# Patient Record
Sex: Male | Born: 1990
Health system: Southern US, Community
[De-identification: ages and names within clinical notes are randomized; demographics above are authoritative.]

## PROBLEM LIST (undated history)

## (undated) DIAGNOSIS — F419 Anxiety disorder, unspecified: Secondary | ICD-10-CM

## (undated) HISTORY — PX: CHOLECYSTECTOMY: SHX55

## (undated) HISTORY — DX: Anxiety disorder, unspecified: F41.9

## (undated) HISTORY — PX: UMBILICAL HERNIA REPAIR: SUR1181

---
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
## 2009-10-15 ENCOUNTER — Ambulatory Visit: Payer: Self-pay | Admitting: Family Medicine

## 2010-01-14 ENCOUNTER — Emergency Department: Payer: Self-pay | Admitting: Emergency Medicine

## 2010-06-24 NOTE — Op Note (Signed)
NAMEAURYN, PAIGE NO.:  [id number]   MEDICAL RECORD NO.:  [id number]          PATIENT TYPE:  AMB   LOCATION:  DFTL                         FACILITY:  [facility]   PHYSICIAN:  Jon Gills, M.D.  DATE OF BIRTH:  [birth year]   DATE OF PROCEDURE:  05/04/2006  DATE OF DISCHARGE:  05/04/2006                               OPERATIVE REPORT   PREOPERATIVE DIAGNOSIS:  Epigastric abdominal pain and nausea.   POSTOPERATIVE DIAGNOSIS:  Epigastric abdominal pain and nausea.   OPERATION:  Upper gastrointestinal endoscopy with biopsy.   SURGEON:  Jon Gills, MD   ASSISTANTS:  None.   DESCRIPTION OF FINDINGS:  Following informed written consent, the  patient was taken to the operating room and placed under general  anesthesia with continuous cardiopulmonary monitoring.  He remained in  the supine position and the Pentax endoscope was passed by mouth and  advanced without difficulty.  A competent lower esophageal sphincter was  present 44 cm from the incisors.  There was no visual evidence for  esophagitis, gastritis, duodenitis or peptic ulcer disease.  A solitary  gastric biopsy was negative for Helicobacter.  Multiple esophageal,  gastric and duodenal biopsies were obtained and found to be  histologically normal, except for mild reflux esophagitis.  The  endoscope was gradually withdrawn and the patient was awakened and taken  to the recovery room in satisfactory condition.  He will be released  later today to the care of his family.   DESCRIPTION OF TECHNICAL PROCEDURES USED:  Pentax upper GI endoscope  with cold biopsy forceps.   DESCRIPTION OF SPECIMENS REMOVED:  Esophagus x3 in formalin, gastric x1  for CLOtesting, gastric x3 in formalin and duodenum x3 in formalin.           ______________________________  Jon Gills, M.D.     JHC/MEDQ  D:  05/17/2006  T:  05/18/2006  Job:  [id number]   cc:   [address], [place], [place] [address] Tracey Harries,  Eugenio Hoes. MD
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
## 2010-11-02 LAB — COMPREHENSIVE METABOLIC PANEL
ALT: 29
AST: 34
Albumin: 4.4
Alkaline Phosphatase: 73
Calcium: 9.3
Chloride: 104
Glucose, Bld: 92
Potassium: 4.8
Sodium: 138

## 2010-11-02 LAB — CBC
Hemoglobin: 16.5 — ABNORMAL HIGH
MCHC: 34.4
Platelets: 235
RBC: 5.13
RDW: 13.2

## 2010-11-02 LAB — URINE MICROSCOPIC-ADD ON

## 2010-11-02 LAB — DIFFERENTIAL
Basophils Absolute: 0.1
Lymphs Abs: 1.7
Monocytes Relative: 9

## 2010-11-02 LAB — RAPID URINE DRUG SCREEN, HOSP PERFORMED
Benzodiazepines: NOT DETECTED
Cocaine: NOT DETECTED

## 2010-11-02 LAB — URINALYSIS, ROUTINE W REFLEX MICROSCOPIC
Glucose, UA: NEGATIVE
Ketones, ur: 15 — AB
Leukocytes, UA: NEGATIVE

## 2013-11-19 ENCOUNTER — Emergency Department: Payer: Self-pay | Admitting: Emergency Medicine

## 2016-11-01 ENCOUNTER — Ambulatory Visit
Admission: RE | Admit: 2016-11-01 | Discharge: 2016-11-01 | Disposition: A | Discharge status: Home / Self Care | Payer: 59 | Source: Intra-hospital | Attending: Internal Medicine | Admitting: Internal Medicine

## 2016-11-01 ENCOUNTER — Other Ambulatory Visit: Payer: Self-pay | Admitting: Internal Medicine

## 2016-11-01 ENCOUNTER — Ambulatory Visit
Admission: RE | Admit: 2016-11-01 | Discharge: 2016-11-01 | Disposition: A | Discharge status: Home / Self Care | Payer: 59 | Source: Ambulatory Visit | Attending: Internal Medicine | Admitting: Internal Medicine

## 2016-11-01 DIAGNOSIS — R0602 Shortness of breath: Secondary | ICD-10-CM | POA: Diagnosis present

## 2017-09-13 ENCOUNTER — Ambulatory Visit (HOSPITAL_COMMUNITY)
Admission: EM | Admit: 2017-09-13 | Discharge: 2017-09-13 | Disposition: A | Discharge status: Home / Self Care | Payer: 59
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
## 2017-09-17 ENCOUNTER — Encounter (HOSPITAL_COMMUNITY): Payer: Self-pay | Admitting: Emergency Medicine

## 2017-09-17 ENCOUNTER — Emergency Department (HOSPITAL_COMMUNITY)
Admission: EM | Admit: 2017-09-17 | Discharge: 2017-09-17 | Disposition: A | Discharge status: Home / Self Care | Payer: Worker's Compensation | Attending: Emergency Medicine | Admitting: Emergency Medicine

## 2017-09-17 DIAGNOSIS — X131XXD Other contact with steam and other hot vapors, subsequent encounter: Secondary | ICD-10-CM | POA: Insufficient documentation

## 2017-09-17 DIAGNOSIS — Z043 Encounter for examination and observation following other accident: Secondary | ICD-10-CM | POA: Insufficient documentation

## 2017-09-17 DIAGNOSIS — Y9289 Other specified places as the place of occurrence of the external cause: Secondary | ICD-10-CM | POA: Diagnosis not present

## 2017-09-17 DIAGNOSIS — Z79899 Other long term (current) drug therapy: Secondary | ICD-10-CM | POA: Diagnosis not present

## 2017-09-17 DIAGNOSIS — T22011D Burn of unspecified degree of right forearm, subsequent encounter: Secondary | ICD-10-CM | POA: Diagnosis not present

## 2017-09-17 DIAGNOSIS — T300 Burn of unspecified body region, unspecified degree: Secondary | ICD-10-CM

## 2017-09-17 MED ORDER — SILVER SULFADIAZINE 1 % EX CREA: 1.0000 "application " | TOPICAL_CREAM | Freq: Every day | CUTANEOUS | 0 refills | Status: DC

## 2017-09-17 NOTE — ED Notes (Signed)
Pt stable, ambulatory, states understanding of discharge instructions 

## 2017-09-17 NOTE — ED Provider Notes (Signed)
[name] Provider Note   CSN: [id number] Arrival date & time: 09/17/17  1244   History   Chief Complaint Chief Complaint  Patient presents with  . Wound Check    HPI Joel Andersen is a 27 y.o. male.  HPI    27 year old male presents today or wound check. Patient notes approximately one week ago he suffered an injury to his right forearm where he had burst causing a burn injury to the forearm. He notes he was seen at urgent care, developed a blister over the area shortly after and was started on Silvadene cream. Patient notes since that time he has been using the Silvadene cream, he notes ongoing pain at the forearm, denies any significant discharge from the redness or fever. Patient was seen at occupational health and referred here for repeat evaluation.      History reviewed. No pertinent past medical history.  There are no active problems to display for this patient.   History reviewed. No pertinent surgical history.      Home Medications    Prior to Admission medications   Medication Sig Start Date End Date Taking? Authorizing Provider  venlafaxine XR (EFFEXOR-XR) 75 MG 24 hr capsule Take 75 mg by mouth daily. 07/30/17  Yes [provider]    Family History No family history on file.  Social History Social History   Tobacco Use  . Smoking status: Not on file  Substance Use Topics  . Alcohol use: Not on file  . Drug use: Not on file     Allergies   Patient has no known allergies.   Review of Systems Review of Systems  All other systems reviewed and are negative.  Physical Exam Updated Vital Signs BP 124/61 (BP Location: Right Arm)   Temp 98.6 F (37 C) (Oral)   Resp 18   SpO2 96%   Physical Exam  Constitutional: He is oriented to person, place, and time. He appears well-developed and well-nourished.  HENT:  Head: Normocephalic and atraumatic.  Eyes: Pupils are equal, round, and reactive to  light. Conjunctivae are normal. Right eye exhibits no discharge. Left eye exhibits no discharge. No scleral icterus.  Neck: Normal range of motion. No JVD present. No tracheal deviation present.  Pulmonary/Chest: Effort normal. No stridor.  Musculoskeletal:  Right forearm with large area of superficial partial thickness burn, granulation tissue noted,   Neurological: He is alert and oriented to person, place, and time. Coordination normal.  Psychiatric: He has a normal mood and affect. His behavior is normal. Judgment and thought content normal.  Nursing note and vitals reviewed.          ED Treatments / Results  Labs (all labs ordered are listed, but only abnormal results are displayed) Labs Reviewed - No data to display  EKG None  Radiology No results found.  Procedures Procedures (including critical care time)  Medications Ordered in ED Medications - No data to display   Initial Impression / Assessment and Plan / ED Course  I have reviewed the triage vital signs and the nursing notes.  Pertinent labs & imaging results that were available during my care of the patient were reviewed by me and considered in my medical decision making (see chart for details).     Labs:   Imaging:  Consults:  Therapeutics:  Discharge Meds:   Assessment/Plan: 27 year old male presents today for wound check. Patient has burn to the right upper extremity. He has no signs  of infection, I do feel he would benefit from wound care follow-up. He'll be referred to [facility], he is given strict return cautioned, he verbalized understanding and agreement to today's plan had no further questions concerns.   Final Clinical Impressions(s) / ED Diagnoses   Final diagnoses:  Burn    ED Discharge Orders    None       Rosalio LoudHedges, Leeum Sankey, PA-C 09/17/17 1631    Loren RacerYelverton, David, MD 09/17/17 [id number]

## 2017-09-17 NOTE — Discharge Instructions (Addendum)
Please read attached information. If you experience any new or worsening signs or symptoms please return to the emergency room for evaluation. Please follow-up with your primary care provider or specialist as discussed.  °

## 2017-09-17 NOTE — ED Triage Notes (Addendum)
Patient sent here from occupation health for wound check, cleaning, and to be setup with a wound clinic. Patient burned right forearm when a steam line ruptured at his work. Patient alert, oriented, and in on apparent distress at this time.

## 2018-09-20 ENCOUNTER — Ambulatory Visit: Payer: 59 | Admitting: Internal Medicine

## 2018-09-25 ENCOUNTER — Other Ambulatory Visit: Payer: Self-pay

## 2018-09-25 ENCOUNTER — Ambulatory Visit (INDEPENDENT_AMBULATORY_CARE_PROVIDER_SITE_OTHER): Payer: 59 | Admitting: Internal Medicine

## 2018-09-25 ENCOUNTER — Encounter: Payer: Self-pay | Admitting: Internal Medicine

## 2018-09-25 DIAGNOSIS — F411 Generalized anxiety disorder: Secondary | ICD-10-CM

## 2018-09-25 MED ORDER — VENLAFAXINE HCL ER 150 MG PO CP24: 150.0000 mg | ORAL_CAPSULE | Freq: Every day | ORAL | 2 refills | Status: DC

## 2018-09-25 MED ORDER — HYDROXYZINE HCL 10 MG PO TABS: 10.0000 mg | ORAL_TABLET | Freq: Every day | ORAL | 0 refills | Status: DC | PRN

## 2018-09-25 NOTE — Progress Notes (Signed)
HPI  Pt presents to the clinic today to establish care and for management of the conditions listed below. He is transferring care from [facility].  Anxiety: Chronic since being a teenager. He has no idea what triggers this. He has been on Citalopram in the past, transitioned to Effexor when that no longer seemed to work. He reports the Effexor worked great in the past, it just doesn't seem effecting. He feels like he is more anxious on a daily basis and now having periods which he would consider panic attacks. He has felt a little down due to not be able to life a normal life due to the pandemic, but denies overt depression. He has seen a therapist in the past and did not feel like it was helpful. He has supportive family and friends. He denies SI/HI.  Flu: 11/2016 Tetanus: 2019 Dentist: as needed  Past Medical History:  Diagnosis Date  . Anxiety     Current Outpatient Medications  Medication Sig Dispense Refill  . venlafaxine XR (EFFEXOR-XR) 75 MG 24 hr capsule Take 75 mg by mouth daily.  7   No current facility-administered medications for this visit.     Allergies  Allergen Reactions  . Hydrocodone-Acetaminophen Rash    Family History  Problem Relation Age of Onset  . Diabetes Mother   . Hypertension Mother   . Diabetes Maternal Grandmother   . Hypertension Maternal Grandmother     Social History   Socioeconomic History  . Marital status: Married    Spouse name: Not on file  . Number of children: Not on file  . Years of education: Not on file  . Highest education level: Not on file  Occupational History  . Not on file  Social Needs  . Financial resource strain: Not on file  . Food insecurity    Worry: Not on file    Inability: Not on file  . Transportation needs    Medical: Not on file    Non-medical: Not on file  Tobacco Use  . Smoking status: Never Smoker  . Smokeless tobacco: Current User    Types: Chew  Substance and Sexual Activity  . Alcohol  use: Yes    Comment: rare  . Drug use: Never  . Sexual activity: Not on file  Lifestyle  . Physical activity    Days per week: Not on file    Minutes per session: Not on file  . Stress: Not on file  Relationships  . Social connections    Talks on phone: Not on file    Gets together: Not on file    Attends religious service: Not on file    Active member of club or organization: Not on file    Attends meetings of clubs or organizations: Not on file    Relationship status: Not on file  . Intimate partner violence    Fear of current or ex partner: Not on file    Emotionally abused: Not on file    Physically abused: Not on file    Forced sexual activity: Not on file  Other Topics Concern  . Not on file  Social History Narrative  . Not on file    ROS:  Constitutional: Denies fever, malaise, fatigue, headache or abrupt weight changes.  Respiratory: Denies difficulty breathing, shortness of breath, cough or sputum production.   Cardiovascular: Denies chest pain, chest tightness, palpitations or swelling in the hands or feet.  Neurological: Denies dizziness, difficulty with memory, difficulty with   speech or problems with balance and coordination.  Psych: Pt reports anxiety and depression. Denies SI/HI.  No other specific complaints in a complete review of systems (except as listed in HPI above).  PE:  BP 134/80   Pulse 92   Temp 98.7 F (37.1 C) (Temporal)   Ht 5' 9" (1.753 m)   Wt 186 lb (84.4 kg)   SpO2 98%   BMI 27.47 kg/m   Wt Readings from Last 3 Encounters:  No data found for Wt    General: Appears his stated age, well developed, well nourished in NAD. Skin: Dry and intact. Cardiovascular: Normal rate and rhythm. S1,S2 noted.  No murmur, rubs or gallops noted.  Pulmonary/Chest: Normal effort and positive vesicular breath sounds. No respiratory distress. No wheezes, rales or ronchi noted.  Neurological: Alert and oriented.  Psychiatric: Anxious appearing.    BMET    Component Value Date/Time   NA 138 06/26/2007 1457   K 4.8 SLIGHT HEMOLYSIS 06/26/2007 1457   CL 104 06/26/2007 1457   CO2 26 06/26/2007 1457   GLUCOSE 92 06/26/2007 1457   BUN 8 06/26/2007 1457   CREATININE 0.79 06/26/2007 1457   CALCIUM 9.3 06/26/2007 1457   GFRNONAA NOT CALCULATED 06/26/2007 1457   GFRAA  06/26/2007 1457    NOT CALCULATED        The eGFR has been calculated using the MDRD equation. This calculation has not been validated in all clinical    Lipid Panel  No results found for: CHOL, TRIG, HDL, CHOLHDL, VLDL, LDLCALC  CBC    Component Value Date/Time   WBC 7.6 06/26/2007 1457   RBC 5.13 06/26/2007 1457   HGB 16.5 (H) 06/26/2007 1457   HCT 48.0 06/26/2007 1457   PLT 235 06/26/2007 1457   MCV 93.7 06/26/2007 1457   MCHC 34.4 06/26/2007 1457   RDW 13.2 06/26/2007 1457   LYMPHSABS 1.7 06/26/2007 1457   MONOABS 0.7 06/26/2007 1457   EOSABS 0.0 06/26/2007 1457   BASOSABS 0.1 06/26/2007 1457    Hgb A1C No results found for: HGBA1C   Assessment and Plan:

## 2018-09-25 NOTE — Patient Instructions (Signed)

## 2018-09-26 DIAGNOSIS — F411 Generalized anxiety disorder: Secondary | ICD-10-CM | POA: Insufficient documentation

## 2018-09-26 NOTE — Assessment & Plan Note (Signed)
Support offered today He declines referral to psychology for CBT Increase Effexor to 150 mg daily, RX sent to pharmacy RX for Hydroxyzine 10 mg daily prn for acute panic- sedation caution given  RTC in 1 month for your annual exam, follow up of anxiety

## 2018-10-18 ENCOUNTER — Other Ambulatory Visit: Payer: Self-pay | Admitting: Internal Medicine

## 2018-10-30 ENCOUNTER — Encounter: Payer: 59 | Admitting: Internal Medicine

## 2018-11-01 ENCOUNTER — Ambulatory Visit: Payer: 59 | Admitting: Internal Medicine

## 2018-11-13 ENCOUNTER — Encounter: Payer: 59 | Admitting: Internal Medicine

## 2018-11-28 ENCOUNTER — Other Ambulatory Visit: Payer: Self-pay | Admitting: Internal Medicine

## 2018-11-29 ENCOUNTER — Ambulatory Visit: Payer: 59 | Admitting: Internal Medicine

## 2018-11-29 ENCOUNTER — Encounter: Payer: Self-pay | Admitting: Internal Medicine

## 2018-11-29 ENCOUNTER — Other Ambulatory Visit: Payer: Self-pay

## 2018-11-29 DIAGNOSIS — F411 Generalized anxiety disorder: Secondary | ICD-10-CM | POA: Diagnosis not present

## 2018-11-29 MED ORDER — ALPRAZOLAM 0.25 MG PO TABS: 0.2500 mg | ORAL_TABLET | Freq: Every day | ORAL | 0 refills | Status: DC

## 2018-12-07 ENCOUNTER — Encounter: Payer: Self-pay | Admitting: Internal Medicine

## 2018-12-07 NOTE — Patient Instructions (Signed)

## 2018-12-07 NOTE — Progress Notes (Signed)
Subjective:    Patient ID: Joel Andersen, male    DOB: 03-11-1990, 28 y.o.   MRN: [id number]  HPI  Patient presents to the clinic today for follow-up of anxiety.  At his last visit he was started on Effexor and Hydroxyzine.  He reports the Effexor seems to be improving his mood and decreasing his overall anxiety but the Hydroxyzine does not appear to be doing anything for his panic attacks.  He is still unable to identify triggers for his anxiety.  He is not currently seeing a therapist.  He denies depression, SI/HI.  Review of Systems      Past Medical History:  Diagnosis Date  . Anxiety     Current Outpatient Medications  Medication Sig Dispense Refill  . hydrOXYzine (ATARAX/VISTARIL) 10 MG tablet Take 1 tablet (10 mg total) by mouth daily as needed. 30 tablet 0  . venlafaxine XR (EFFEXOR-XR) 150 MG 24 hr capsule TAKE 1 CAPSULE (150 MG TOTAL) BY MOUTH DAILY WITH BREAKFAST. 30 capsule 0  . ALPRAZolam (XANAX) 0.25 MG tablet Take 1 tablet (0.25 mg total) by mouth daily. 20 tablet 0   No current facility-administered medications for this visit.     Allergies  Allergen Reactions  . Hydrocodone-Acetaminophen Rash    Family History  Problem Relation Age of Onset  . Diabetes Mother   . Hypertension Mother   . Diabetes Maternal Grandmother   . Hypertension Maternal Grandmother     Social History   Socioeconomic History  . Marital status: Married    Spouse name: Not on file  . Number of children: Not on file  . Years of education: Not on file  . Highest education level: Not on file  Occupational History  . Not on file  Social Needs  . Financial resource strain: Not on file  . Food insecurity    Worry: Not on file    Inability: Not on file  . Transportation needs    Medical: Not on file    Non-medical: Not on file  Tobacco Use  . Smoking status: Never Smoker  . Smokeless tobacco: Current User    Types: Chew  Substance and Sexual Activity  . Alcohol use: Yes   Comment: rare  . Drug use: Never  . Sexual activity: Not on file  Lifestyle  . Physical activity    Days per week: Not on file    Minutes per session: Not on file  . Stress: Not on file  Relationships  . Social Herbalist on phone: Not on file    Gets together: Not on file    Attends religious service: Not on file    Active member of club or organization: Not on file    Attends meetings of clubs or organizations: Not on file    Relationship status: Not on file  . Intimate partner violence    Fear of current or ex partner: Not on file    Emotionally abused: Not on file    Physically abused: Not on file    Forced sexual activity: Not on file  Other Topics Concern  . Not on file  Social History Narrative  . Not on file     Constitutional: Denies fever, malaise, fatigue, headache or abrupt weight changes.  Respiratory: Denies difficulty breathing, shortness of breath, cough or sputum production.   Cardiovascular: Denies chest pain, chest tightness, palpitations or swelling in the hands or feet.  difficulty with speech or problems with balance  and coordination.  Psych: Patient reports anxiety.  Denies  depression, SI/HI.  No other specific complaints in a complete review of systems (except as listed in HPI above).  Objective:   Physical Exam   BP 124/90 (BP Location: Left Arm, Patient Position: Sitting, Cuff Size: Normal)   Pulse (!) 107   Temp 99.3 F (37.4 C)   Ht '5\' 9"'$  (1.753 m)   Wt 185 lb (83.9 kg)   SpO2 96%   BMI 27.32 kg/m  Wt Readings from Last 3 Encounters:  11/29/18 185 lb (83.9 kg)  09/25/18 186 lb (84.4 kg)    General: Appears his stated age, well developed, well nourished in NAD.Marland Kitchen  Cardiovascular: Tachycardic with normal rhythm. S1,S2 noted.  No murmur, rubs or gallops noted. No JVD or BLE edema. No carotid bruits noted. Pulmonary/Chest: Normal effort and positive vesicular breath sounds. No respiratory distress. No wheezes, rales or rhonchi  noted. Neurological: Alert and oriented.  Psychiatric: Mood and affect normal.  Mildly anxious appearing.  Judgment and thought content normal.    BMET    Component Value Date/Time   NA 138 06/26/2007 1457   K 4.8 SLIGHT HEMOLYSIS 06/26/2007 1457   CL 104 06/26/2007 1457   CO2 26 06/26/2007 1457   GLUCOSE 92 06/26/2007 1457   BUN 8 06/26/2007 1457   CREATININE 0.79 06/26/2007 1457   CALCIUM 9.3 06/26/2007 1457   GFRNONAA NOT CALCULATED 06/26/2007 1457   GFRAA  06/26/2007 1457    NOT CALCULATED        The eGFR has been calculated using the MDRD equation. This calculation has not been validated in all clinical    Lipid Panel  No results found for: CHOL, TRIG, HDL, CHOLHDL, VLDL, LDLCALC  CBC    Component Value Date/Time   WBC 7.6 06/26/2007 1457   RBC 5.13 06/26/2007 1457   HGB 16.5 (H) 06/26/2007 1457   HCT 48.0 06/26/2007 1457   PLT 235 06/26/2007 1457   MCV 93.7 06/26/2007 1457   MCHC 34.4 06/26/2007 1457   RDW 13.2 06/26/2007 1457   LYMPHSABS 1.7 06/26/2007 1457   MONOABS 0.7 06/26/2007 1457   EOSABS 0.0 06/26/2007 1457   BASOSABS 0.1 06/26/2007 1457    Hgb A1C No results found for: HGBA1C         Assessment & Plan:

## 2018-12-07 NOTE — Assessment & Plan Note (Signed)
Continue Effexor Advised him to take Hydroxyzine as needed for sleep Rx for Xanax 0.25 mg to take on a very rare basis as needed for panic-addiction caution given Support offered today Will obtain CSA and UDS at next visit

## 2018-12-10 ENCOUNTER — Other Ambulatory Visit: Payer: Self-pay | Admitting: Internal Medicine

## 2019-01-13 ENCOUNTER — Encounter: Payer: BC Managed Care – PPO | Admitting: Internal Medicine

## 2019-01-13 NOTE — Progress Notes (Deleted)
Subjective:    Patient ID: Joel Andersen, male    DOB: 18-Nov-1990, 28 y.o.   MRN: [id number]  HPI  Pt presents to the clinic today for his annual exam.  Flu: Tetanus: Dentist:  Diet: Exercise:  Review of Systems  Past Medical History:  Diagnosis Date  . Anxiety     Current Outpatient Medications  Medication Sig Dispense Refill  . ALPRAZolam (XANAX) 0.25 MG tablet Take 1 tablet (0.25 mg total) by mouth daily. 20 tablet 0  . hydrOXYzine (ATARAX/VISTARIL) 10 MG tablet Take 1 tablet (10 mg total) by mouth daily as needed. 30 tablet 0  . venlafaxine XR (EFFEXOR-XR) 150 MG 24 hr capsule TAKE 1 CAPSULE (150 MG TOTAL) BY MOUTH DAILY WITH BREAKFAST. 90 capsule 0   No current facility-administered medications for this visit.     Allergies  Allergen Reactions  . Hydrocodone-Acetaminophen Rash    Family History  Problem Relation Age of Onset  . Diabetes Mother   . Hypertension Mother   . Diabetes Maternal Grandmother   . Hypertension Maternal Grandmother     Social History   Socioeconomic History  . Marital status: Married    Spouse name: Not on file  . Number of children: Not on file  . Years of education: Not on file  . Highest education level: Not on file  Occupational History  . Not on file  Social Needs  . Financial resource strain: Not on file  . Food insecurity    Worry: Not on file    Inability: Not on file  . Transportation needs    Medical: Not on file    Non-medical: Not on file  Tobacco Use  . Smoking status: Never Smoker  . Smokeless tobacco: Current User    Types: Chew  Substance and Sexual Activity  . Alcohol use: Yes    Comment: rare  . Drug use: Never  . Sexual activity: Not on file  Lifestyle  . Physical activity    Days per week: Not on file    Minutes per session: Not on file  . Stress: Not on file  Relationships  . Social Herbalist on phone: Not on file    Gets together: Not on file    Attends religious service: Not  on file    Active member of club or organization: Not on file    Attends meetings of clubs or organizations: Not on file    Relationship status: Not on file  . Intimate partner violence    Fear of current or ex partner: Not on file    Emotionally abused: Not on file    Physically abused: Not on file    Forced sexual activity: Not on file  Other Topics Concern  . Not on file  Social History Narrative  . Not on file     Constitutional: Denies fever, malaise, fatigue, headache or abrupt weight changes.  HEENT: Denies eye pain, eye redness, ear pain, ringing in the ears, wax buildup, runny nose, nasal congestion, bloody nose, or sore throat. Respiratory: Denies difficulty breathing, shortness of breath, cough or sputum production.   Cardiovascular: Denies chest pain, chest tightness, palpitations or swelling in the hands or feet.  Gastrointestinal: Denies abdominal pain, bloating, constipation, diarrhea or blood in the stool.  GU: Denies urgency, frequency, pain with urination, burning sensation, blood in urine, odor or discharge. Musculoskeletal: Denies decrease in range of motion, difficulty with gait, muscle pain or joint pain and swelling.  Skin: Denies redness, rashes, lesions or ulcercations.  Neurological: Denies dizziness, difficulty with memory, difficulty with speech or problems with balance and coordination.  Psych: Denies anxiety, depression, SI/HI.  No other specific complaints in a complete review of systems (except as listed in HPI above).     Objective:   Physical Exam  There were no vitals taken for this visit. Wt Readings from Last 3 Encounters:  11/29/18 185 lb (83.9 kg)  09/25/18 186 lb (84.4 kg)    General: Appears their stated age, well developed, well nourished in NAD. Skin: Warm, dry and intact. No rashes, lesions or ulcerations noted. HEENT: Head: normal shape and size; Eyes: sclera white, no icterus, conjunctiva pink, PERRLA and EOMs intact; Ears: Tm's  gray and intact, normal light reflex; Nose: mucosa pink and moist, septum midline; Throat/Mouth: Teeth present, mucosa pink and moist, no exudate, lesions or ulcerations noted.  Neck:  Neck supple, trachea midline. No masses, lumps or thyromegaly present.  Cardiovascular: Normal rate and rhythm. S1,S2 noted.  No murmur, rubs or gallops noted. No JVD or BLE edema. No carotid bruits noted. Pulmonary/Chest: Normal effort and positive vesicular breath sounds. No respiratory distress. No wheezes, rales or ronchi noted.  Abdomen: Soft and nontender. Normal bowel sounds. No distention or masses noted. Liver, spleen and kidneys non palpable. Musculoskeletal: Normal range of motion. No signs of joint swelling. No difficulty with gait.  Neurological: Alert and oriented. Cranial nerves II-XII grossly intact. Coordination normal.  Psychiatric: Mood and affect normal. Behavior is normal. Judgment and thought content normal.   EKG:  BMET    Component Value Date/Time   NA 138 06/26/2007 1457   K 4.8 SLIGHT HEMOLYSIS 06/26/2007 1457   CL 104 06/26/2007 1457   CO2 26 06/26/2007 1457   GLUCOSE 92 06/26/2007 1457   BUN 8 06/26/2007 1457   CREATININE 0.79 06/26/2007 1457   CALCIUM 9.3 06/26/2007 1457   GFRNONAA NOT CALCULATED 06/26/2007 1457   GFRAA  06/26/2007 1457    NOT CALCULATED        The eGFR has been calculated using the MDRD equation. This calculation has not been validated in all clinical    Lipid Panel  No results found for: CHOL, TRIG, HDL, CHOLHDL, VLDL, LDLCALC  CBC    Component Value Date/Time   WBC 7.6 06/26/2007 1457   RBC 5.13 06/26/2007 1457   HGB 16.5 (H) 06/26/2007 1457   HCT 48.0 06/26/2007 1457   PLT 235 06/26/2007 1457   MCV 93.7 06/26/2007 1457   MCHC 34.4 06/26/2007 1457   RDW 13.2 06/26/2007 1457   LYMPHSABS 1.7 06/26/2007 1457   MONOABS 0.7 06/26/2007 1457   EOSABS 0.0 06/26/2007 1457   BASOSABS 0.1 06/26/2007 1457    Hgb A1C No results found for: HGBA1C           Assessment & Plan:   Preventative Health Maintenance:  Flu shot Tetanus Encouraged him to consume a balanced diet and exercise regimen Advised him to see a dentist annually Will check CBC, CMET, Lipid profile today  RTC in 1 year, sooner if needed Webb Silversmith, NP This visit occurred during the SARS-CoV-2 public health emergency.  Safety protocols were in place, including screening questions prior to the visit, additional usage of staff PPE, and extensive cleaning of exam room while observing appropriate contact time as indicated for disinfecting solutions.

## 2019-01-15 ENCOUNTER — Other Ambulatory Visit: Payer: Self-pay

## 2019-01-15 DIAGNOSIS — Z20822 Contact with and (suspected) exposure to covid-19: Secondary | ICD-10-CM

## 2019-01-17 ENCOUNTER — Encounter: Payer: Self-pay | Admitting: Internal Medicine

## 2019-01-17 ENCOUNTER — Ambulatory Visit (INDEPENDENT_AMBULATORY_CARE_PROVIDER_SITE_OTHER): Payer: BC Managed Care – PPO | Admitting: Internal Medicine

## 2019-01-17 DIAGNOSIS — R197 Diarrhea, unspecified: Secondary | ICD-10-CM

## 2019-01-17 DIAGNOSIS — R519 Headache, unspecified: Secondary | ICD-10-CM

## 2019-01-17 DIAGNOSIS — R05 Cough: Secondary | ICD-10-CM | POA: Diagnosis not present

## 2019-01-17 DIAGNOSIS — R0989 Other specified symptoms and signs involving the circulatory and respiratory systems: Secondary | ICD-10-CM

## 2019-01-17 DIAGNOSIS — Z20828 Contact with and (suspected) exposure to other viral communicable diseases: Secondary | ICD-10-CM

## 2019-01-17 DIAGNOSIS — R1033 Periumbilical pain: Secondary | ICD-10-CM

## 2019-01-17 DIAGNOSIS — R059 Cough, unspecified: Secondary | ICD-10-CM

## 2019-01-17 DIAGNOSIS — R0602 Shortness of breath: Secondary | ICD-10-CM

## 2019-01-17 DIAGNOSIS — R110 Nausea: Secondary | ICD-10-CM

## 2019-01-17 DIAGNOSIS — Z20822 Contact with and (suspected) exposure to covid-19: Secondary | ICD-10-CM

## 2019-01-17 LAB — NOVEL CORONAVIRUS, NAA: SARS-CoV-2, NAA: NOT DETECTED

## 2019-01-17 MED ORDER — PROMETHAZINE-DM 6.25-15 MG/5ML PO SYRP: 5.0000 mL | ORAL_SOLUTION | Freq: Four times a day (QID) | ORAL | 0 refills | Status: DC | PRN

## 2019-01-17 MED ORDER — IBUPROFEN 800 MG PO TABS: 800.0000 mg | ORAL_TABLET | Freq: Three times a day (TID) | ORAL | 0 refills | Status: DC | PRN

## 2019-01-17 MED ORDER — ALBUTEROL SULFATE HFA 108 (90 BASE) MCG/ACT IN AERS: 2.0000 | INHALATION_SPRAY | Freq: Four times a day (QID) | RESPIRATORY_TRACT | 0 refills | Status: DC | PRN

## 2019-01-17 NOTE — Patient Instructions (Signed)
COVID-19 Frequently Asked Questions °COVID-19 (coronavirus disease) is an infection that is caused by a large family of viruses. Some viruses cause illness in people and others cause illness in animals like camels, cats, and bats. In some cases, the viruses that cause illness in animals can spread to humans. °Where did the coronavirus come from? °In December 2019, [place] told the World Health Organization (WHO) of several cases of lung disease (human respiratory illness). These cases were linked to an open seafood and livestock market in the city of [place]. The link to the seafood and livestock market suggests that the virus may have spread from animals to humans. However, since that first outbreak in December, the virus has also been shown to spread from person to person. °What is the name of the disease and the virus? °Disease name °Early on, this disease was called novel coronavirus. This is because scientists determined that the disease was caused by a new (novel) respiratory virus. The World Health Organization (WHO) has now named the disease COVID-19, or coronavirus disease. °Virus name °The virus that causes the disease is called severe acute respiratory syndrome coronavirus 2 (SARS-CoV-2). °More information on disease and virus naming °World Health Organization (WHO): www.who.int/emergencies/diseases/novel-coronavirus-2019/technical-guidance/naming-the-coronavirus-disease-(covid-2019)-and-the-virus-that-causes-it °Who is at risk for complications from coronavirus disease? °Some people may be at higher risk for complications from coronavirus disease. This includes older adults and people who have chronic diseases, such as heart disease, diabetes, and lung disease. °If you are at higher risk for complications, take these extra precautions: °· Avoid close contact with people who are sick or have a fever or cough. Stay at least 3-6 ft (1-2 m) away from them, if possible. °· Wash your hands often with soap and  water for at least 20 seconds. °· Avoid touching your face, mouth, nose, or eyes. °· Keep supplies on hand at home, such as food, medicine, and cleaning supplies. °· Stay home as much as possible. °· Avoid social gatherings and travel. °How does coronavirus disease spread? °The virus that causes coronavirus disease spreads easily from person to person (is contagious). There are also cases of community-spread disease. This means the disease has spread to: °· People who have no known contact with other infected people. °· People who have not traveled to areas where there are known cases. °It appears to spread from one person to another through droplets from coughing or sneezing. °Can I get the virus from touching surfaces or objects? °There is still a lot that we do not know about the virus that causes coronavirus disease. Scientists are basing a lot of information on what they know about similar viruses, such as: °· Viruses cannot generally survive on surfaces for long. They need a human body (host) to survive. °· It is more likely that the virus is spread by close contact with people who are sick (direct contact), such as through: °? Shaking hands or hugging. °? Breathing in respiratory droplets that travel through the air. This can happen when an infected person coughs or sneezes on or near other people. °· It is less likely that the virus is spread when a person touches a surface or object that has the virus on it (indirect contact). The virus may be able to enter the body if the person touches a surface or object and then touches his or her face, eyes, nose, or mouth. °Can a person spread the virus without having symptoms of the disease? °It may be possible for the virus to spread before a person   has symptoms of the disease, but this is most likely not the main way the virus is spreading. It is more likely for the virus to spread by being in close contact with people who are sick and breathing in the respiratory  droplets of a sick person's cough or sneeze. °What are the symptoms of coronavirus disease? °Symptoms vary from person to person and can range from mild to severe. Symptoms may include: °· Fever. °· Cough. °· Tiredness, weakness, or fatigue. °· Fast breathing or feeling [place] of breath. °These symptoms can appear anywhere from 2 to 14 days after you have been exposed to the virus. If you develop symptoms, call your health care provider. People with severe symptoms may need hospital care. °If I am exposed to the virus, how long does it take before symptoms start? °Symptoms of coronavirus disease may appear anywhere from 2 to 14 days after a person has been exposed to the virus. If you develop symptoms, call your health care provider. °Should I be tested for this virus? °Your health care provider will decide whether to test you based on your symptoms, history of exposure, and your risk factors. °How does a health care provider test for this virus? °Health care providers will collect samples to send for testing. Samples may include: °· Taking a swab of fluid from the nose. °· Taking fluid from the lungs by having you cough up mucus (sputum) into a sterile cup. °· Taking a blood sample. °· Taking a stool or urine sample. °Is there a treatment or vaccine for this virus? °Currently, there is no vaccine to prevent coronavirus disease. Also, there are no medicines like antibiotics or antivirals to treat the virus. A person who becomes sick is given supportive care, which means rest and fluids. A person may also relieve his or her symptoms by using over-the-counter medicines that treat sneezing, coughing, and runny nose. These are the same medicines that a person takes for the common cold. °If you develop symptoms, call your health care provider. People with severe symptoms may need hospital care. °What can I do to protect myself and my family from this virus? ° °  ° °You can protect yourself and your family by taking the  same actions that you would take to prevent the spread of other viruses. Take the following actions: °· Wash your hands often with soap and water for at least 20 seconds. If soap and water are not available, use alcohol-based hand sanitizer. °· Avoid touching your face, mouth, nose, or eyes. °· Cough or sneeze into a tissue, sleeve, or elbow. Do not cough or sneeze into your hand or the air. °? If you cough or sneeze into a tissue, throw it away immediately and wash your hands. °· Disinfect objects and surfaces that you frequently touch every day. °· Avoid close contact with people who are sick or have a fever or cough. Stay at least 3-6 ft (1-2 m) away from them, if possible. °· Stay home if you are sick, except to get medical care. Call your health care provider before you get medical care. °· Make sure your vaccines are up to date. Ask your health care provider what vaccines you need. °What should I do if I need to travel? °Follow travel recommendations from your local health authority, the CDC, and WHO. °Travel information and advice °· Centers for Disease Control and Prevention (CDC): www.cdc.gov/coronavirus/2019-ncov/travelers/index.html °· World Health Organization (WHO): www.who.int/emergencies/diseases/novel-coronavirus-2019/travel-advice °Know the risks and take action to protect your health °·   You are at higher risk of getting coronavirus disease if you are traveling to areas with an outbreak or if you are exposed to travelers from areas with an outbreak. °· Wash your hands often and practice good hygiene to lower the risk of catching or spreading the virus. °What should I do if I am sick? °General instructions to stop the spread of infection °· Wash your hands often with soap and water for at least 20 seconds. If soap and water are not available, use alcohol-based hand sanitizer. °· Cough or sneeze into a tissue, sleeve, or elbow. Do not cough or sneeze into your hand or the air. °· If you cough or  sneeze into a tissue, throw it away immediately and wash your hands. °· Stay home unless you must get medical care. Call your health care provider or local health authority before you get medical care. °· Avoid public areas. Do not take public transportation, if possible. °· If you can, wear a mask if you must go out of the house or if you are in close contact with someone who is not sick. °Keep your home clean °· Disinfect objects and surfaces that are frequently touched every day. This may include: °? Counters and tables. °? Doorknobs and light switches. °? Sinks and faucets. °? Electronics such as phones, remote controls, keyboards, computers, and tablets. °· Wash dishes in hot, soapy water or use a dishwasher. Air-dry your dishes. °· Wash laundry in hot water. °Prevent infecting other household members °· Let healthy household members care for children and pets, if possible. If you have to care for children or pets, wash your hands often and wear a mask. °· Sleep in a different bedroom or bed, if possible. °· Do not share personal items, such as razors, toothbrushes, deodorant, combs, brushes, towels, and washcloths. °Where to find more information °Centers for Disease Control and Prevention (CDC) °· Information and news updates: www.cdc.gov/coronavirus/2019-ncov °World Health Organization (WHO) °· Information and news updates: www.who.int/emergencies/diseases/novel-coronavirus-2019 °· Coronavirus health topic: www.who.int/health-topics/coronavirus °· Questions and answers on COVID-19: www.who.int/news-room/q-a-detail/q-a-coronaviruses °· Global tracker: who.sprinklr.com °American Academy of Pediatrics (AAP) °· Information for families: www.healthychildren.org/English/health-issues/conditions/chest-lungs/Pages/2019-Novel-Coronavirus.aspx °The coronavirus situation is changing rapidly. Check your local health authority website or the CDC and WHO websites for updates and news. °When should I contact a health care  provider? °· Contact your health care provider if you have symptoms of an infection, such as fever or cough, and you: °? Have been near anyone who is known to have coronavirus disease. °? Have come into contact with a person who is suspected to have coronavirus disease. °? Have traveled outside of the country. °When should I get emergency medical care? °· Get help right away by calling your local emergency services (911 in the [place] if you have: °? Trouble breathing. °? Pain or pressure in your chest. °? Confusion. °? Blue-tinged lips and fingernails. °? Difficulty waking from sleep. °? Symptoms that get worse. °Let the emergency medical personnel know if you think you have coronavirus disease. °Summary °· A new respiratory virus is spreading from person to person and causing COVID-19 (coronavirus disease). °· The virus that causes COVID-19 appears to spread easily. It spreads from one person to another through droplets from coughing or sneezing. °· Older adults and those with chronic diseases are at higher risk of disease. If you are at higher risk for complications, take extra precautions. °· There is currently no vaccine to prevent coronavirus disease. There are no medicines, such as antibiotics or   antivirals, to treat the virus. °· You can protect yourself and your family by washing your hands often, avoiding touching your face, and covering your coughs and sneezes. °This information is not intended to replace advice given to you by your health care provider. Make sure you discuss any questions you have with your health care provider. °Document Released: 05/21/2018 Document Revised: 05/21/2018 Document Reviewed: 05/21/2018 °Elsevier Patient Education © 2020 [employer] ° °

## 2019-01-17 NOTE — Progress Notes (Signed)
Virtual Visit via Video Note  I connected with Joel Andersen on 01/17/19 at  2:00 PM EST by a video enabled telemedicine application and verified that I am speaking with the correct person using two identifiers.  Location: Patient: Home Provider: Office   I discussed the limitations of evaluation and management by telemedicine and the availability of in person appointments. The patient expressed understanding and agreed to proceed.  History of Present Illness:  Pt reports headaches, runny nose, cough, abdominal pain and diarrhea. This started 1 week ago. The headaches are located in his forehead. He describes the pain as throbbing, pressure. He denies dizziness, visual changes, sensitivity to light or sound. He is blowing clear mucous out of his nose The cough is productive of clear mucous. He is SOB with talking for long periods of time and extertion. He reports associated nausea, periumbilical abdominal pain, and diarrhea. He denies fever or chills but has body aches. He denies rashes. He was tested for COVID on 12/9 (negative). He has tried Tylenol, Dayquil and Nyquil with minimal relief. He has had positive exposure to COVID 19.   Past Medical History:  Diagnosis Date  . Anxiety     Current Outpatient Medications  Medication Sig Dispense Refill  . ALPRAZolam (XANAX) 0.25 MG tablet Take 1 tablet (0.25 mg total) by mouth daily. 20 tablet 0  . hydrOXYzine (ATARAX/VISTARIL) 10 MG tablet Take 1 tablet (10 mg total) by mouth daily as needed. 30 tablet 0  . venlafaxine XR (EFFEXOR-XR) 150 MG 24 hr capsule TAKE 1 CAPSULE (150 MG TOTAL) BY MOUTH DAILY WITH BREAKFAST. 90 capsule 0   No current facility-administered medications for this visit.    Allergies  Allergen Reactions  . Hydrocodone-Acetaminophen Rash    Family History  Problem Relation Age of Onset  . Diabetes Mother   . Hypertension Mother   . Diabetes Maternal Grandmother   . Hypertension Maternal Grandmother     Social  History   Socioeconomic History  . Marital status: Married    Spouse name: Not on file  . Number of children: Not on file  . Years of education: Not on file  . Highest education level: Not on file  Occupational History  . Not on file  Tobacco Use  . Smoking status: Never Smoker  . Smokeless tobacco: Current User    Types: Chew  Substance and Sexual Activity  . Alcohol use: Yes    Comment: rare  . Drug use: Never  . Sexual activity: Not on file  Other Topics Concern  . Not on file  Social History Narrative  . Not on file   Social Determinants of Health   Financial Resource Strain:   . Difficulty of Paying Living Expenses: Not on file  Food Insecurity:   . Worried About Charity fundraiser in the Last Year: Not on file  . Ran Out of Food in the Last Year: Not on file  Transportation Needs:   . Lack of Transportation (Medical): Not on file  . Lack of Transportation (Non-Medical): Not on file  Physical Activity:   . Days of Exercise per Week: Not on file  . Minutes of Exercise per Session: Not on file  Stress:   . Feeling of Stress : Not on file  Social Connections:   . Frequency of Communication with Friends and Family: Not on file  . Frequency of Social Gatherings with Friends and Family: Not on file  . Attends Religious Services: Not on file  .  Active Member of Clubs or Organizations: Not on file  . Attends Archivist Meetings: Not on file  . Marital Status: Not on file  Intimate Partner Violence:   . Fear of Current or Ex-Partner: Not on file  . Emotionally Abused: Not on file  . Physically Abused: Not on file  . Sexually Abused: Not on file     Constitutional: Pt reports headaches. Denies fever, malaise, fatigue, or abrupt weight changes.  HEENT: Pt reports runny nose. Denies eye pain, eye redness, ear pain, ringing in the ears, wax buildup, nasal congestion, bloody nose, or sore throat. Respiratory: Pt reports cough and SOB. Denies difficulty  breathing, or sputum production.   Cardiovascular: Denies chest pain, chest tightness, palpitations or swelling in the hands or feet.  Gastrointestinal: Pt reports nausea, abdominal pain and diarrhea. Denies bloating, constipation, or blood in the stool.  Musculoskeletal: Pt reports body aches. Denies decrease in range of motion, difficulty with gait, or joint pain and swelling.  Skin: Denies redness, rashes, lesions or ulcercations.    No other specific complaints in a complete review of systems (except as listed in HPI above).   Observations/Objective:  There were no vitals taken for this visit.  Wt Readings from Last 3 Encounters:  11/29/18 185 lb (83.9 kg)  09/25/18 186 lb (84.4 kg)    General: Appears his stated age, well developed, well nourished in NAD. Skin: Warm, dry and intact. No rashes noted. HEENT: Head: normal shape and size; Nose: sounds congested; Throat/Mouth: sounds hoarse Pulmonary/Chest: Normal effort. No respiratory distress. Nonproductive cough noted during conversation. Abdomen: Points to the umbilical area as his site of pain. Neurological: Alert and oriented.   BMET    Component Value Date/Time   NA 138 06/26/2007 1457   K 4.8 SLIGHT HEMOLYSIS 06/26/2007 1457   CL 104 06/26/2007 1457   CO2 26 06/26/2007 1457   GLUCOSE 92 06/26/2007 1457   BUN 8 06/26/2007 1457   CREATININE 0.79 06/26/2007 1457   CALCIUM 9.3 06/26/2007 1457   GFRNONAA NOT CALCULATED 06/26/2007 1457   GFRAA  06/26/2007 1457    NOT CALCULATED        The eGFR has been calculated using the MDRD equation. This calculation has not been validated in all clinical    Lipid Panel  No results found for: CHOL, TRIG, HDL, CHOLHDL, VLDL, LDLCALC  CBC    Component Value Date/Time   WBC 7.6 06/26/2007 1457   RBC 5.13 06/26/2007 1457   HGB 16.5 (H) 06/26/2007 1457   HCT 48.0 06/26/2007 1457   PLT 235 06/26/2007 1457   MCV 93.7 06/26/2007 1457   MCHC 34.4 06/26/2007 1457   RDW 13.2  06/26/2007 1457   LYMPHSABS 1.7 06/26/2007 1457   MONOABS 0.7 06/26/2007 1457   EOSABS 0.0 06/26/2007 1457   BASOSABS 0.1 06/26/2007 1457    Hgb A1C No results found for: HGBA1C     Assessment and Plan:  Acute Headache, Runny Nose, Cough, SOB, Nausea, Abdominal Pain and Diarrhea:  Presume COVID despite negative test- I think he tested too early Encouraged rest and fluids Take 800 mg Ibuprofen and Tylenol 1000 mg every 8 hours for headache  Start Zyrtec and Flonase OTC RX for Albuterol inhaler 1-2 puffs Q4-6H prn RX for Promethazine DM for cough Avoid antidiarrheals OTC Discussed the importance of self quarantine for 14 days from symptoms onset- work note provided Encouraged frequent handwashing, masking and social distancing eben in the home ER precautions discussed  Follow  Up Instructions:    I discussed the assessment and treatment plan with the patient. The patient was provided an opportunity to ask questions and all were answered. The patient agreed with the plan and demonstrated an understanding of the instructions.   The patient was advised to call back or seek an in-person evaluation if the symptoms worsen or if the condition fails to improve as anticipated.    Webb Silversmith, NP

## 2019-02-05 ENCOUNTER — Encounter: Payer: BC Managed Care – PPO | Admitting: Internal Medicine

## 2019-02-11 ENCOUNTER — Other Ambulatory Visit: Payer: Self-pay

## 2019-02-11 ENCOUNTER — Ambulatory Visit: Payer: BC Managed Care – PPO | Admitting: Internal Medicine

## 2019-02-11 ENCOUNTER — Encounter: Payer: Self-pay | Admitting: Internal Medicine

## 2019-02-11 VITALS — BP 132/84 | HR 105 | Temp 98.5°F | Wt 189.0 lb

## 2019-02-11 DIAGNOSIS — K047 Periapical abscess without sinus: Secondary | ICD-10-CM

## 2019-02-11 MED ORDER — CEFTRIAXONE SODIUM 1 G IJ SOLR
1.0000 g | Freq: Once | INTRAMUSCULAR | Status: AC
  Administered 2019-02-11: 10:00:00 1 g via INTRAMUSCULAR

## 2019-02-11 MED ORDER — DEXAMETHASONE SODIUM PHOSPHATE 10 MG/ML IJ SOLN
10.0000 mg | Freq: Once | INTRAMUSCULAR | Status: AC
  Administered 2019-02-11: 10:00:00 10 mg via INTRAMUSCULAR

## 2019-02-11 MED ORDER — AMOXICILLIN 500 MG PO CAPS: 500.0000 mg | ORAL_CAPSULE | Freq: Three times a day (TID) | ORAL | 0 refills | Status: DC

## 2019-02-11 NOTE — Progress Notes (Signed)
Subjective:    Patient ID: Joel Andersen, male    DOB: [birth year], 29 y.o.   MRN: [id number]  HPI  Pt presents to the clinic today with c/o dental pain and facial swelling. This started yesterday but got progressively worse over the course of the day. He has a know cracked tooth of the right upper jaw. He has not had this evaluated by a dentist. He took Tylenol OTC with minimal relief.   Review of Systems  Past Medical History:  Diagnosis Date  . Anxiety     Current Outpatient Medications  Medication Sig Dispense Refill  . albuterol (VENTOLIN HFA) 108 (90 Base) MCG/ACT inhaler Inhale 2 puffs into the lungs every 6 (six) hours as needed for wheezing or shortness of breath. 18 g 0  . ALPRAZolam (XANAX) 0.25 MG tablet Take 1 tablet (0.25 mg total) by mouth daily. 20 tablet 0  . hydrOXYzine (ATARAX/VISTARIL) 10 MG tablet Take 1 tablet (10 mg total) by mouth daily as needed. 30 tablet 0  . ibuprofen (ADVIL) 800 MG tablet Take 1 tablet (800 mg total) by mouth every 8 (eight) hours as needed. 30 tablet 0  . promethazine-dextromethorphan (PROMETHAZINE-DM) 6.25-15 MG/5ML syrup Take 5 mLs by mouth 4 (four) times daily as needed. 118 mL 0  . venlafaxine XR (EFFEXOR-XR) 150 MG 24 hr capsule TAKE 1 CAPSULE (150 MG TOTAL) BY MOUTH DAILY WITH BREAKFAST. 90 capsule 0   No current facility-administered medications for this visit.    Allergies  Allergen Reactions  . Hydrocodone-Acetaminophen Rash    Family History  Problem Relation Age of Onset  . Diabetes Mother   . Hypertension Mother   . Diabetes Maternal Grandmother   . Hypertension Maternal Grandmother     Social History   Socioeconomic History  . Marital status: Married    Spouse name: Not on file  . Number of children: Not on file  . Years of education: Not on file  . Highest education level: Not on file  Occupational History  . Not on file  Tobacco Use  . Smoking status: Never Smoker  . Smokeless tobacco: Current User   Types: Chew  Substance and Sexual Activity  . Alcohol use: Yes    Comment: rare  . Drug use: Never  . Sexual activity: Not on file  Other Topics Concern  . Not on file  Social History Narrative  . Not on file   Social Determinants of Health   Financial Resource Strain:   . Difficulty of Paying Living Expenses: Not on file  Food Insecurity:   . Worried About Charity fundraiser in the Last Year: Not on file  . Ran Out of Food in the Last Year: Not on file  Transportation Needs:   . Lack of Transportation (Medical): Not on file  . Lack of Transportation (Non-Medical): Not on file  Physical Activity:   . Days of Exercise per Week: Not on file  . Minutes of Exercise per Session: Not on file  Stress:   . Feeling of Stress : Not on file  Social Connections:   . Frequency of Communication with Friends and Family: Not on file  . Frequency of Social Gatherings with Friends and Family: Not on file  . Attends Religious Services: Not on file  . Active Member of Clubs or Organizations: Not on file  . Attends Archivist Meetings: Not on file  . Marital Status: Not on file  Intimate Partner Violence:   .  Fear of Current or Ex-Partner: Not on file  . Emotionally Abused: Not on file  . Physically Abused: Not on file  . Sexually Abused: Not on file     Constitutional: Denies fever, malaise, fatigue, headache or abrupt weight changes.  HEENT: Pt reports dental pain, facial swelling. Denies eye pain, eye redness, ear pain, ringing in the ears, wax buildup, runny nose, nasal congestion, bloody nose, or sore throat. Respiratory: Denies difficulty breathing, shortness of breath, cough or sputum production.   Cardiovascular: Denies chest pain, chest tightness, palpitations or swelling in the hands or feet.    No other specific complaints in a complete review of systems (except as listed in HPI above).     Objective:   Physical Exam BP 132/84   Pulse (!) 105   Temp 98.5 F  (36.9 C) (Temporal)   Wt 189 lb (85.7 kg)   SpO2 98%   BMI 27.91 kg/m   Wt Readings from Last 3 Encounters:  11/29/18 185 lb (83.9 kg)  09/25/18 186 lb (84.4 kg)    General: Appears his stated age, well developed, well nourished in NAD. Skin: Warm, dry and intact. No redness noted on the right side of the face. HEENT: Head: normal shape and size; Eyes: sclera white, no icterus, conjunctiva pink, PERRLA and EOMs intact;Throat/Mouth: Teeth present with one cracked tooth, buccal mucosa red and inflamed, no exudate, lesions or ulcerations noted.  Neck:  No adenopathy.  Cardiovascular: Tachycardic with normal rhythm. S1,S2 noted.  No murmur, rubs or gallops noted.  Pulmonary/Chest: Normal effort and positive vesicular breath sounds. No respiratory distress. No wheezes, rales or ronchi noted.    BMET    Component Value Date/Time   NA 138 06/26/2007 1457   K 4.8 SLIGHT HEMOLYSIS 06/26/2007 1457   CL 104 06/26/2007 1457   CO2 26 06/26/2007 1457   GLUCOSE 92 06/26/2007 1457   BUN 8 06/26/2007 1457   CREATININE 0.79 06/26/2007 1457   CALCIUM 9.3 06/26/2007 1457   GFRNONAA NOT CALCULATED 06/26/2007 1457   GFRAA  06/26/2007 1457    NOT CALCULATED        The eGFR has been calculated using the MDRD equation. This calculation has not been validated in all clinical    Lipid Panel  No results found for: CHOL, TRIG, HDL, CHOLHDL, VLDL, LDLCALC  CBC    Component Value Date/Time   WBC 7.6 06/26/2007 1457   RBC 5.13 06/26/2007 1457   HGB 16.5 (H) 06/26/2007 1457   HCT 48.0 06/26/2007 1457   PLT 235 06/26/2007 1457   MCV 93.7 06/26/2007 1457   MCHC 34.4 06/26/2007 1457   RDW 13.2 06/26/2007 1457   LYMPHSABS 1.7 06/26/2007 1457   MONOABS 0.7 06/26/2007 1457   EOSABS 0.0 06/26/2007 1457   BASOSABS 0.1 06/26/2007 1457    Hgb A1C No results found for: HGBA1C          Assessment & Plan:   Dental Abscess:  Rocephin 1 gm IM x 1 Decadron 10 mg IM x 1 Rx for Amoxil 500 mg  TID x7 days Encouraged salt water gurgles   Advised him to see dentist for extraction once infection has resolved. Webb Silversmith, NP This visit occurred during the SARS-CoV-2 public health emergency.  Safety protocols were in place, including screening questions prior to the visit, additional usage of staff PPE, and extensive cleaning of exam room while observing appropriate contact time as indicated for disinfecting solutions.

## 2019-02-11 NOTE — Patient Instructions (Signed)
Dental Abscess  A dental abscess is an area of pus in or around a tooth. It comes from an infection. It can cause pain and other symptoms. Treatment will help with symptoms and prevent the infection from spreading. Follow these instructions at home: Medicines  Take over-the-counter and prescription medicines only as told by your dentist.  If you were prescribed an antibiotic medicine, take it as told by your dentist. Do not stop taking it even if you start to feel better.  If you were prescribed a gel that has numbing medicine in it, use it exactly as told.  Do not drive or use heavy machinery (like a lawn mower) while taking prescription pain medicine. General instructions  Rinse out your mouth often with salt water. ? To make salt water, dissolve -1 tsp of salt in 1 cup of warm water.  Eat a soft diet while your mouth is healing.  Drink enough fluid to keep your urine pale yellow.  Do not apply heat to the outside of your mouth.  Do not use any products that contain nicotine or tobacco. These include cigarettes and e-cigarettes. If you need help quitting, ask your doctor.  Keep all follow-up visits as told by your dentist. This is important. Prevent an abscess  Brush your teeth every morning and every night. Use fluoride toothpaste.  Floss your teeth each day.  Get dental cleanings as often as told by your dentist.  Think about getting dental sealant put on teeth that have deep holes (decay).  Drink water that has fluoride in it. ? Most tap water has fluoride. ? Check the label on bottled water to see if it has fluoride in it.  Drink water instead of sugary drinks.  Eat healthy meals and snacks.  Wear a mouth guard or face shield when you play sports. Contact a doctor if:  Your pain is worse, and medicine does not help. Get help right away if:  You have a fever or chills.  Your symptoms suddenly get worse.  You have a very bad headache.  You have problems  breathing or swallowing.  You have trouble opening your mouth.  You have swelling in your neck or close to your eye. Summary  A dental abscess is an area of pus in or around a tooth. It is caused by an infection.  Treatment will help with symptoms and prevent the infection from spreading.  Take over-the-counter and prescription medicines only as told by your dentist.  To prevent an abscess, take good care of your teeth. Brush your teeth every morning and night. Use floss every day.  Get dental cleanings as often as told by your dentist. This information is not intended to replace advice given to you by your health care provider. Make sure you discuss any questions you have with your health care provider. Document Revised: 05/15/2018 Document Reviewed: 09/25/2016 Elsevier Patient Education  2020 [employer]  

## 2019-02-11 NOTE — Addendum Note (Signed)
Addended by: Roena Malady on: 02/11/2019 09:58 AM   Modules accepted: Orders

## 2019-02-21 ENCOUNTER — Encounter: Payer: Self-pay | Admitting: Internal Medicine

## 2019-02-21 ENCOUNTER — Other Ambulatory Visit: Payer: Self-pay

## 2019-02-21 ENCOUNTER — Ambulatory Visit (INDEPENDENT_AMBULATORY_CARE_PROVIDER_SITE_OTHER): Payer: BC Managed Care – PPO | Admitting: Internal Medicine

## 2019-02-21 VITALS — BP 130/86 | HR 96 | Temp 98.2°F | Ht 69.0 in | Wt 190.0 lb

## 2019-02-21 DIAGNOSIS — Z0000 Encounter for general adult medical examination without abnormal findings: Secondary | ICD-10-CM

## 2019-02-21 NOTE — Patient Instructions (Signed)

## 2019-02-21 NOTE — Progress Notes (Signed)
Subjective:    Patient ID: Joel Andersen, male    DOB: December 14, 1990, 29 y.o.   MRN: [id number]  HPI  Pt presents to the clinic today for his annual exam.  Flu: never Tetanus: 2019 Dentist: as needed  Diet: He does eat meat. He occassionally eats fruits and veggies. He occassionally eats fried foods. Exercise: None  Review of Systems      Past Medical History:  Diagnosis Date  . Anxiety     Current Outpatient Medications  Medication Sig Dispense Refill  . albuterol (VENTOLIN HFA) 108 (90 Base) MCG/ACT inhaler Inhale 2 puffs into the lungs every 6 (six) hours as needed for wheezing or shortness of breath. 18 g 0  . ALPRAZolam (XANAX) 0.25 MG tablet Take 1 tablet (0.25 mg total) by mouth daily. 20 tablet 0  . amoxicillin (AMOXIL) 500 MG capsule Take 1 capsule (500 mg total) by mouth 3 (three) times daily. 21 capsule 0  . hydrOXYzine (ATARAX/VISTARIL) 10 MG tablet Take 1 tablet (10 mg total) by mouth daily as needed. 30 tablet 0  . venlafaxine XR (EFFEXOR-XR) 150 MG 24 hr capsule TAKE 1 CAPSULE (150 MG TOTAL) BY MOUTH DAILY WITH BREAKFAST. 90 capsule 0   No current facility-administered medications for this visit.    Allergies  Allergen Reactions  . Hydrocodone-Acetaminophen Rash    Family History  Problem Relation Age of Onset  . Diabetes Mother   . Hypertension Mother   . Diabetes Maternal Grandmother   . Hypertension Maternal Grandmother     Social History   Socioeconomic History  . Marital status: Married    Spouse name: Not on file  . Number of children: Not on file  . Years of education: Not on file  . Highest education level: Not on file  Occupational History  . Not on file  Tobacco Use  . Smoking status: Never Smoker  . Smokeless tobacco: Current User    Types: Chew  Substance and Sexual Activity  . Alcohol use: Yes    Comment: rare  . Drug use: Never  . Sexual activity: Not on file  Other Topics Concern  . Not on file  Social History Narrative    . Not on file   Social Determinants of Health   Financial Resource Strain:   . Difficulty of Paying Living Expenses: Not on file  Food Insecurity:   . Worried About Charity fundraiser in the Last Year: Not on file  . Ran Out of Food in the Last Year: Not on file  Transportation Needs:   . Lack of Transportation (Medical): Not on file  . Lack of Transportation (Non-Medical): Not on file  Physical Activity:   . Days of Exercise per Week: Not on file  . Minutes of Exercise per Session: Not on file  Stress:   . Feeling of Stress : Not on file  Social Connections:   . Frequency of Communication with Friends and Family: Not on file  . Frequency of Social Gatherings with Friends and Family: Not on file  . Attends Religious Services: Not on file  . Active Member of Clubs or Organizations: Not on file  . Attends Archivist Meetings: Not on file  . Marital Status: Not on file  Intimate Partner Violence:   . Fear of Current or Ex-Partner: Not on file  . Emotionally Abused: Not on file  . Physically Abused: Not on file  . Sexually Abused: Not on file  Constitutional: Denies fever, malaise, fatigue, headache or abrupt weight changes.  HEENT: Denies eye pain, eye redness, ear pain, ringing in the ears, wax buildup, runny nose, nasal congestion, bloody nose, or sore throat. Respiratory: Denies difficulty breathing, shortness of breath, cough or sputum production.   Cardiovascular: Denies chest pain, chest tightness, palpitations or swelling in the hands or feet.  Gastrointestinal: Denies abdominal pain, bloating, constipation, diarrhea or blood in the stool.  GU: Denies urgency, frequency, pain with urination, burning sensation, blood in urine, odor or discharge. Musculoskeletal: Denies decrease in range of motion, difficulty with gait, muscle pain or joint pain and swelling.  Skin: Denies redness, rashes, lesions or ulcercations.  Neurological: Denies dizziness, difficulty  with memory, difficulty with speech or problems with balance and coordination.  Psych: Pt reports anxiety. Denies depression, SI/HI.  No other specific complaints in a complete review of systems (except as listed in HPI above).  Objective:   Physical Exam  BP 130/86   Pulse 96   Temp 98.2 F (36.8 C) (Temporal)   Ht '5\' 9"'$  (1.753 m)   Wt 190 lb (86.2 kg)   SpO2 98%   BMI 28.06 kg/m   Wt Readings from Last 3 Encounters:  02/11/19 189 lb (85.7 kg)  11/29/18 185 lb (83.9 kg)  09/25/18 186 lb (84.4 kg)    General: Appears his stated age, well developed, well nourished in NAD. Skin: Warm, dry and intact. No rashesnoted. HEENT: Head: normal shape and size; Eyes: sclera white, no icterus, conjunctiva pink, PERRLA and EOMs intact;  Neck:  Neck supple, trachea midline. No masses, lumps or thyromegaly present.  Cardiovascular: Normal rate and rhythm. S1,S2 noted.  No murmur, rubs or gallops noted. No JVD or BLE edema. Pulmonary/Chest: Normal effort and positive vesicular breath sounds. No respiratory distress. No wheezes, rales or ronchi noted.  Abdomen: Soft and nontender. Normal bowel sounds. No distention or masses noted. Liver, spleen and kidneys non palpable. Musculoskeletal: Strength 5/5 BUE/BLE. No difficulty with gait.  Neurological: Alert and oriented. Cranial nerves II-XII grossly intact. Coordination normal.  Psychiatric: Mood and affect normal. Behavior is normal. Judgment and thought content normal.     BMET    Component Value Date/Time   NA 138 06/26/2007 1457   K 4.8 SLIGHT HEMOLYSIS 06/26/2007 1457   CL 104 06/26/2007 1457   CO2 26 06/26/2007 1457   GLUCOSE 92 06/26/2007 1457   BUN 8 06/26/2007 1457   CREATININE 0.79 06/26/2007 1457   CALCIUM 9.3 06/26/2007 1457   GFRNONAA NOT CALCULATED 06/26/2007 1457   GFRAA  06/26/2007 1457    NOT CALCULATED        The eGFR has been calculated using the MDRD equation. This calculation has not been validated in all  clinical    Lipid Panel  No results found for: CHOL, TRIG, HDL, CHOLHDL, VLDL, LDLCALC  CBC    Component Value Date/Time   WBC 7.6 06/26/2007 1457   RBC 5.13 06/26/2007 1457   HGB 16.5 (H) 06/26/2007 1457   HCT 48.0 06/26/2007 1457   PLT 235 06/26/2007 1457   MCV 93.7 06/26/2007 1457   MCHC 34.4 06/26/2007 1457   RDW 13.2 06/26/2007 1457   LYMPHSABS 1.7 06/26/2007 1457   MONOABS 0.7 06/26/2007 1457   EOSABS 0.0 06/26/2007 1457   BASOSABS 0.1 06/26/2007 1457    Hgb A1C No results found for: HGBA1C         Assessment & Plan:   Preventative Health Maintenance:  He declines flu  shot today Tetanus UTD Encouraged him to consume a balanced diet and exercise regimen Advised him to see a dentist annually Will check CBC, CMET, and Lipid profile today He declines STD screening  RTC in 1 year, sooner if needed Webb Silversmith, NP This visit occurred during the SARS-CoV-2 public health emergency.  Safety protocols were in place, including screening questions prior to the visit, additional usage of staff PPE, and extensive cleaning of exam room while observing appropriate contact time as indicated for disinfecting solutions.

## 2019-02-22 LAB — COMPREHENSIVE METABOLIC PANEL
AG Ratio: 2 (calc) (ref 1.0–2.5)
ALT: 23 U/L (ref 9–46)
AST: 18 U/L (ref 10–40)
Albumin: 4.7 g/dL (ref 3.6–5.1)
Alkaline phosphatase (APISO): 78 U/L (ref 36–130)
BUN: 15 mg/dL (ref 7–25)
CO2: 26 mmol/L (ref 20–32)
Calcium: 9.8 mg/dL (ref 8.6–10.3)
Chloride: 105 mmol/L (ref 98–110)
Creat: 0.9 mg/dL (ref 0.60–1.35)
Globulin: 2.3 g/dL (calc) (ref 1.9–3.7)
Glucose, Bld: 87 mg/dL (ref 65–99)
Potassium: 4.9 mmol/L (ref 3.5–5.3)
Sodium: 141 mmol/L (ref 135–146)
Total Bilirubin: 0.4 mg/dL (ref 0.2–1.2)
Total Protein: 7 g/dL (ref 6.1–8.1)

## 2019-02-22 LAB — LIPID PANEL
Cholesterol: 202 mg/dL — ABNORMAL HIGH (ref <200)
HDL: 30 mg/dL — ABNORMAL LOW (ref >=40)
LDL Cholesterol (Calc): 140 mg/dL (calc) — ABNORMAL HIGH
Non-HDL Cholesterol (Calc): 172 mg/dL (calc) — ABNORMAL HIGH (ref <130)
Total CHOL/HDL Ratio: 6.7 (calc) — ABNORMAL HIGH (ref <5.0)
Triglycerides: 188 mg/dL — ABNORMAL HIGH (ref <150)

## 2019-02-22 LAB — CBC
HCT: 42.2 % (ref 38.5–50.0)
Hemoglobin: 15 g/dL (ref 13.2–17.1)
MCH: 32.4 pg (ref 27.0–33.0)
MCHC: 35.5 g/dL (ref 32.0–36.0)
MCV: 91.1 fL (ref 80.0–100.0)
MPV: 10.4 fL (ref 7.5–12.5)
Platelets: 320 10*3/uL (ref 140–400)
RBC: 4.63 10*6/uL (ref 4.20–5.80)
RDW: 13.5 % (ref 11.0–15.0)
WBC: 8.4 10*3/uL (ref 3.8–10.8)

## 2019-02-26 ENCOUNTER — Encounter: Payer: Self-pay | Admitting: Internal Medicine

## 2019-03-16 ENCOUNTER — Other Ambulatory Visit: Payer: Self-pay | Admitting: Internal Medicine

## 2019-05-22 ENCOUNTER — Ambulatory Visit
Admission: RE | Admit: 2019-05-22 | Discharge: 2019-05-22 | Disposition: A | Discharge status: Home / Self Care | Payer: BC Managed Care – PPO | Source: Ambulatory Visit | Attending: Internal Medicine | Admitting: Internal Medicine

## 2019-05-22 ENCOUNTER — Ambulatory Visit: Payer: BC Managed Care – PPO | Admitting: Internal Medicine

## 2019-05-22 ENCOUNTER — Encounter: Payer: Self-pay | Admitting: Internal Medicine

## 2019-05-22 ENCOUNTER — Other Ambulatory Visit: Payer: Self-pay

## 2019-05-22 VITALS — BP 134/92 | HR 83 | Temp 98.5°F | Wt 196.0 lb

## 2019-05-22 DIAGNOSIS — R195 Other fecal abnormalities: Secondary | ICD-10-CM | POA: Diagnosis not present

## 2019-05-22 DIAGNOSIS — R1033 Periumbilical pain: Secondary | ICD-10-CM

## 2019-05-22 DIAGNOSIS — R1031 Right lower quadrant pain: Secondary | ICD-10-CM | POA: Insufficient documentation

## 2019-05-22 LAB — COMPREHENSIVE METABOLIC PANEL
ALT: 29 U/L (ref 0–53)
AST: 20 U/L (ref 0–37)
Albumin: 4.8 g/dL (ref 3.5–5.2)
Alkaline Phosphatase: 79 U/L (ref 39–117)
BUN: 9 mg/dL (ref 6–23)
CO2: 28 mEq/L (ref 19–32)
Calcium: 9.1 mg/dL (ref 8.4–10.5)
Chloride: 103 mEq/L (ref 96–112)
Creatinine, Ser: 0.84 mg/dL (ref 0.40–1.50)
GFR: 107.51 mL/min (ref >60.00)
Glucose, Bld: 93 mg/dL (ref 70–99)
Potassium: 3.7 mEq/L (ref 3.5–5.1)
Sodium: 138 mEq/L (ref 135–145)
Total Bilirubin: 0.5 mg/dL (ref 0.2–1.2)
Total Protein: 7.2 g/dL (ref 6.0–8.3)

## 2019-05-22 LAB — CBC
HCT: 46.3 % (ref 39.0–52.0)
Hemoglobin: 15.9 g/dL (ref 13.0–17.0)
MCHC: 34.4 g/dL (ref 30.0–36.0)
MCV: 92 fl (ref 78.0–100.0)
Platelets: 311 10*3/uL (ref 150.0–400.0)
RBC: 5.03 Mil/uL (ref 4.22–5.81)
RDW: 13.8 % (ref 11.5–15.5)
WBC: 6.2 10*3/uL (ref 4.0–10.5)

## 2019-05-22 IMAGING — CT CT ABD-PELV W/ CM
2 of 4 series · 16 of 46 positions shown, 18 images · IV contrast (APPLIED)
Comparison: [DATE]

CLINICAL DATA: Right lower quadrant pain 4 days with diarrhea.

EXAM:
CT ABDOMEN AND PELVIS WITH CONTRAST
TECHNIQUE: Multidetector CT imaging of the abdomen and pelvis was performed
using the standard protocol following bolus administration of
intravenous contrast.
CONTRAST:  100mL OMNIPAQUE IOHEXOL 300 MG/ML  SOLN

[Series 2: axial st · axial · 0.75mm/px · z∈[-770,-310]mm · 13 of 101 slices shown, 15 images]
[im 5/101  soft-tissue]
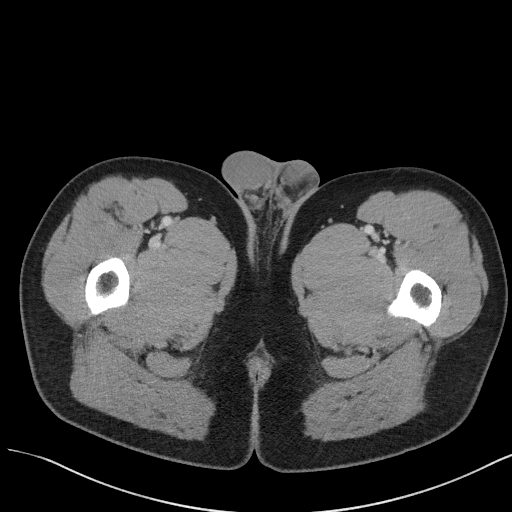
[im 5/101  bone]
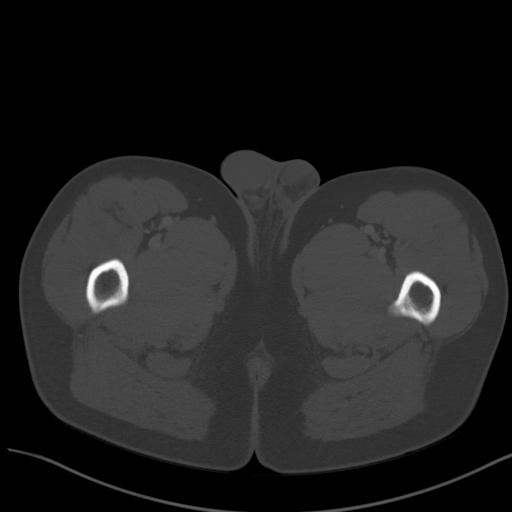
[im 13/101  soft-tissue]
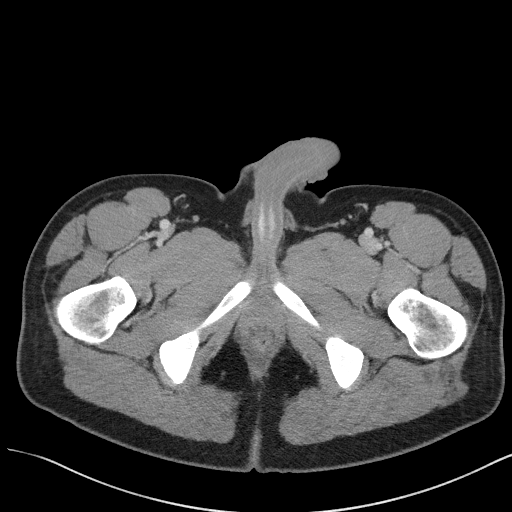
[im 21/101  soft-tissue]
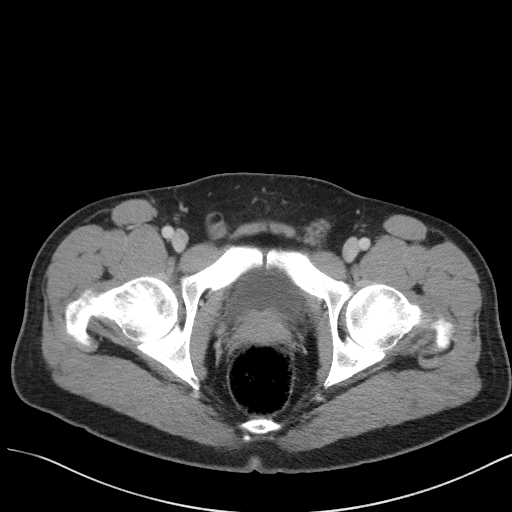
[im 29/101  soft-tissue]
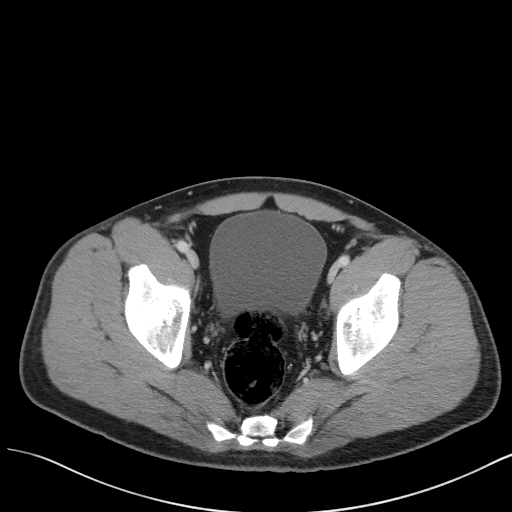
[im 37/101  soft-tissue]
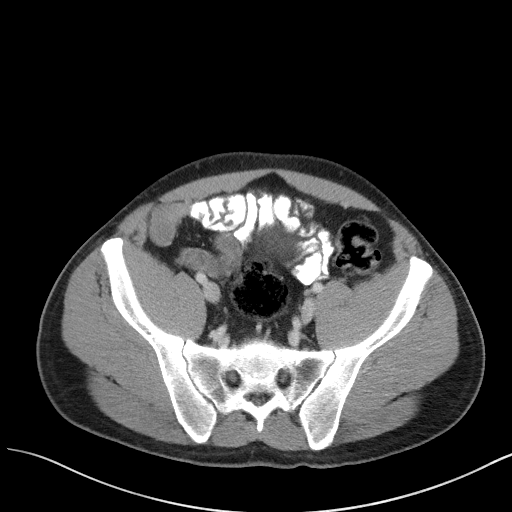
[im 45/101  soft-tissue]
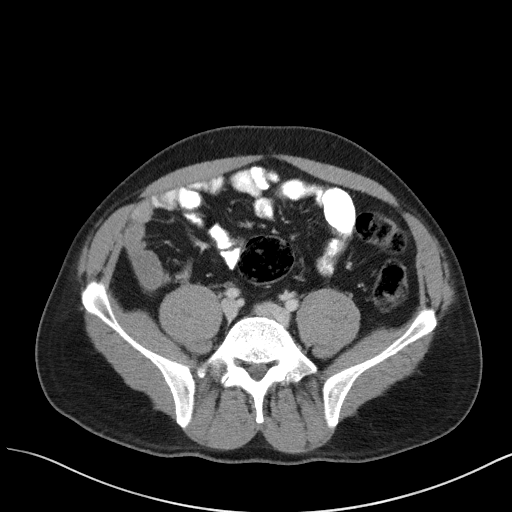
[im 53/101  soft-tissue]
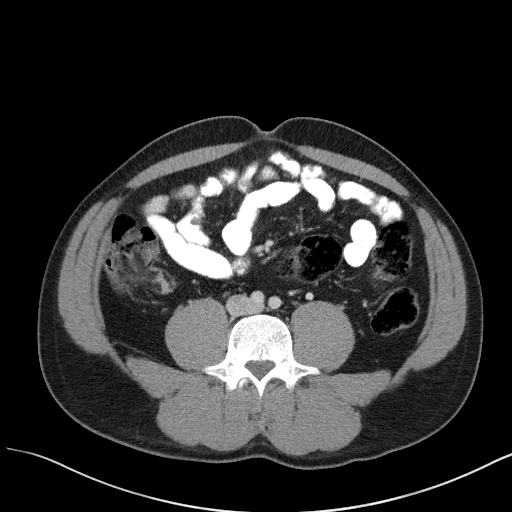
[im 57/101  soft-tissue]
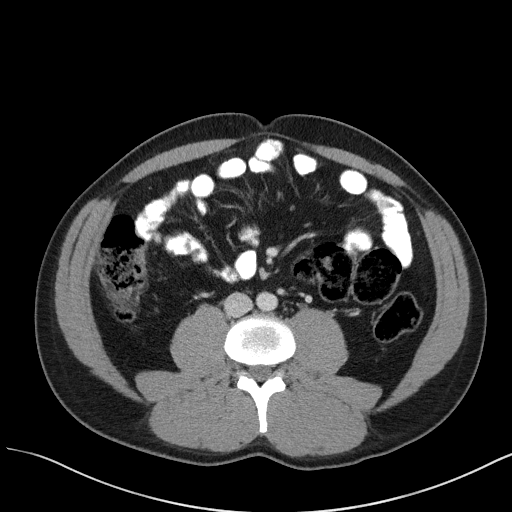
[im 65/101  soft-tissue]
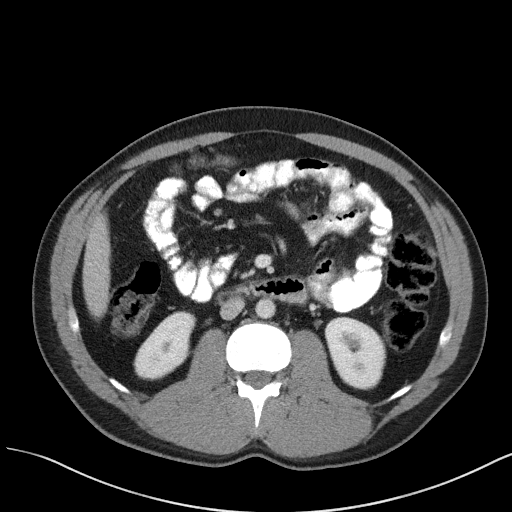
[im 65/101  bone]
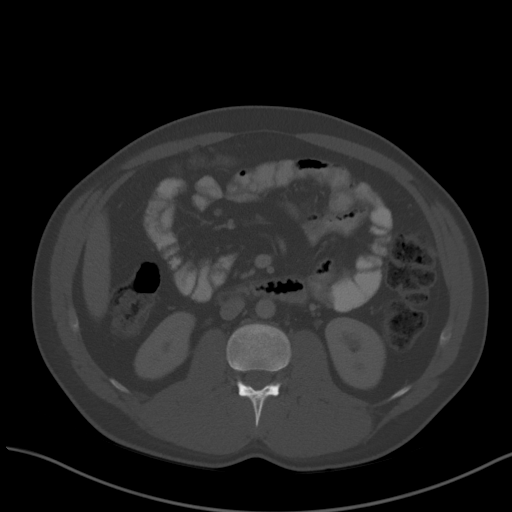
[im 73/101  soft-tissue]
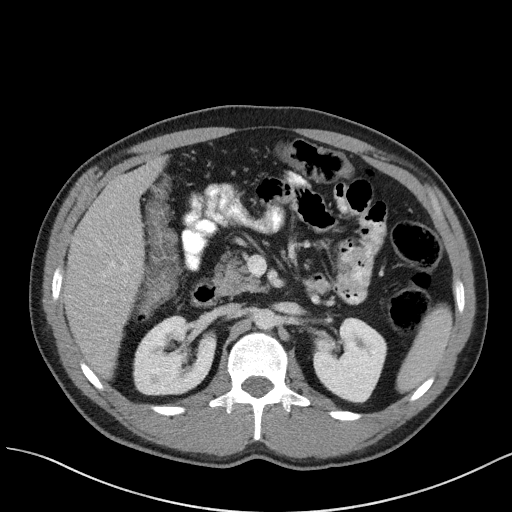
[im 81/101  soft-tissue]
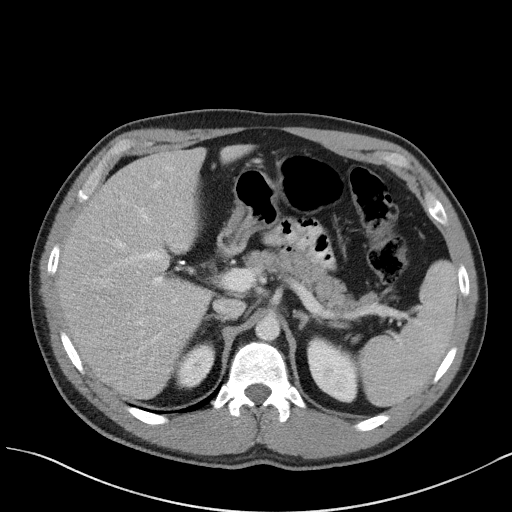
[im 89/101  soft-tissue]
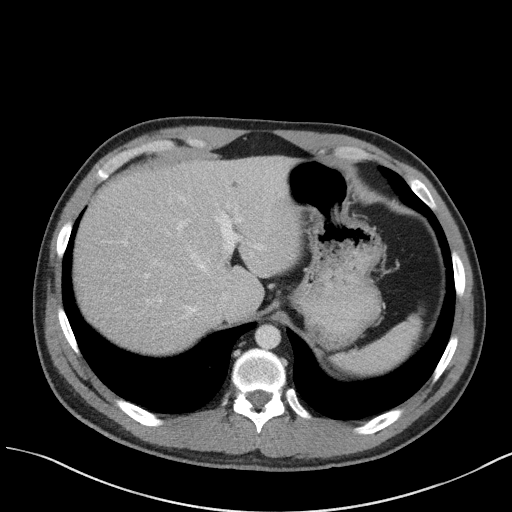
[im 97/101  soft-tissue]
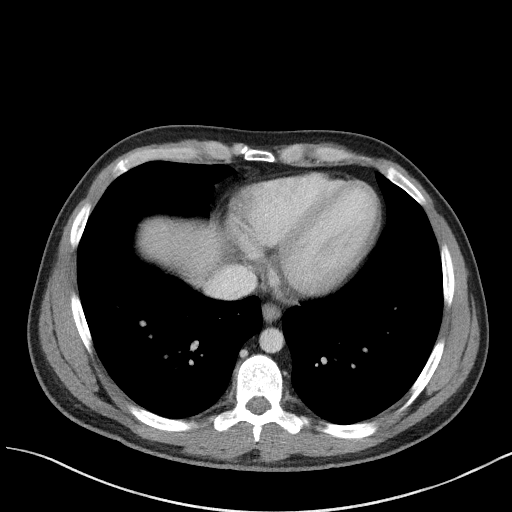

[Series 5: coronal st · coronal · 0.71mm/px · 3 of 94 slices shown]
[im 32/94  soft-tissue]
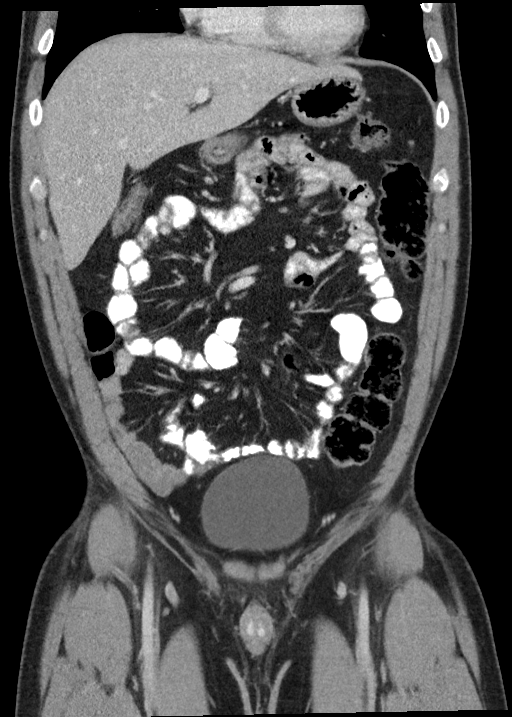
[im 42/94  soft-tissue]
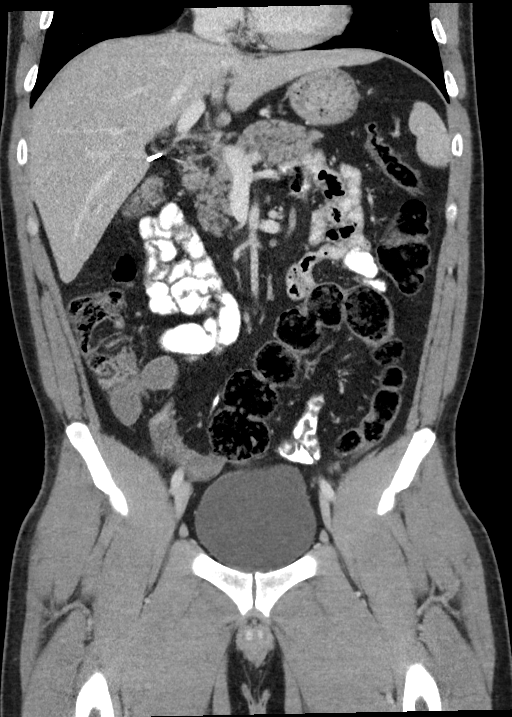
[im 52/94  soft-tissue]
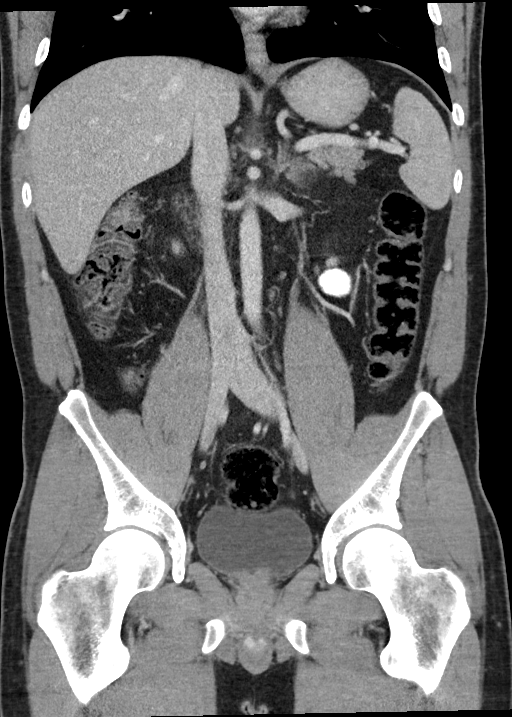

[16 of 46 positions shown; findings below may reference images not displayed]

FINDINGS: Lower chest: Lung bases are clear.

Hepatobiliary: Previous cholecystectomy. Subcentimeter hypodensity
over the left lobe of the liver too small to characterize but likely
a cyst. Biliary tree is normal.

Pancreas: Normal.

Spleen: Normal.

Adrenals/Urinary Tract: Adrenal glands are normal. Kidneys are
normal in size without hydronephrosis. Few bilateral renal cysts
unchanged. Ureters and bladder are normal.

Stomach/Bowel: Stomach and small bowel are normal. Appendix is
normal. There is mild wall thickening over the region of the hepatic
flexure without significant adjacent phlegm [REDACTED] change or free
fluid. Findings may be due to a mild acute colitis. Remainder of the
colon is normal.

Vascular/Lymphatic: Abdominal aorta is normal caliber. No evidence
of adenopathy.

Reproductive: Normal.

Other: No free fluid or focal inflammatory change.

Musculoskeletal: Normal.
IMPRESSION: 1. Mild wall thickening of the colon in the region of the hepatic
flexure which could be due to mild acute colitis.

2.  Couple small bilateral renal cysts unchanged.

3. Subcentimeter hypodensity over the left lobe of the liver too
small to characterize, but likely a cyst.

## 2019-05-22 MED ORDER — METRONIDAZOLE 500 MG PO TABS: 500.0000 mg | ORAL_TABLET | Freq: Three times a day (TID) | ORAL | 0 refills | Status: DC

## 2019-05-22 MED ORDER — CIPROFLOXACIN HCL 500 MG PO TABS: 500.0000 mg | ORAL_TABLET | Freq: Two times a day (BID) | ORAL | 0 refills | Status: DC

## 2019-05-22 MED ORDER — IOHEXOL 300 MG/ML  SOLN
100.0000 mL | Freq: Once | INTRAMUSCULAR | Status: AC | PRN
  Administered 2019-05-22: 13:00:00 100 mL via INTRAVENOUS

## 2019-05-22 NOTE — Progress Notes (Signed)
 Subjective:    Patient ID: Joel Andersen, male    DOB: 12/24/1990, 29 y.o.   MRN: [id number]  HPI  Pt presents to the clinic today with c/o abdominal pain. This started 1 week ago. The pain is located around his belly button. He describes the pain as. The pain worsened today after lifting a drum at work. The pain is worse with movements. He reports the area is tender to touch. He denies pain that radiates into his testicles. He denies nausea, vomiting, constipation, diarrhea or blood in his stool. He did have loose stool this morning which is unusual for him. He had an umbilical hernia repair as a child.  Review of Systems      Past Medical History:  Diagnosis Date  . Anxiety     Current Outpatient Medications  Medication Sig Dispense Refill  . albuterol (VENTOLIN HFA) 108 (90 Base) MCG/ACT inhaler Inhale 2 puffs into the lungs every 6 (six) hours as needed for wheezing or shortness of breath. 18 g 0  . hydrOXYzine (ATARAX/VISTARIL) 10 MG tablet Take 1 tablet (10 mg total) by mouth daily as needed. 30 tablet 0  . venlafaxine XR (EFFEXOR-XR) 150 MG 24 hr capsule TAKE 1 CAPSULE (150 MG TOTAL) BY MOUTH DAILY WITH BREAKFAST. 90 capsule 2   No current facility-administered medications for this visit.    Allergies  Allergen Reactions  . Hydrocodone-Acetaminophen Rash    Family History  Problem Relation Age of Onset  . Diabetes Mother   . Hypertension Mother   . Diabetes Maternal Grandmother   . Hypertension Maternal Grandmother     Social History   Socioeconomic History  . Marital status: Married    Spouse name: Not on file  . Number of children: Not on file  . Years of education: Not on file  . Highest education level: Not on file  Occupational History  . Not on file  Tobacco Use  . Smoking status: Never Smoker  . Smokeless tobacco: Current User    Types: Chew  Substance and Sexual Activity  . Alcohol use: Yes    Comment: rare  . Drug use: Never  . Sexual  activity: Not on file  Other Topics Concern  . Not on file  Social History Narrative  . Not on file   Social Determinants of Health   Financial Resource Strain:   . Difficulty of Paying Living Expenses:   Food Insecurity:   . Worried About Running Out of Food in the Last Year:   . Ran Out of Food in the Last Year:   Transportation Needs:   . Lack of Transportation (Medical):   . Lack of Transportation (Non-Medical):   Physical Activity:   . Days of Exercise per Week:   . Minutes of Exercise per Session:   Stress:   . Feeling of Stress :   Social Connections:   . Frequency of Communication with Friends and Family:   . Frequency of Social Gatherings with Friends and Family:   . Attends Religious Services:   . Active Member of Clubs or Organizations:   . Attends Club or Organization Meetings:   . Marital Status:   Intimate Partner Violence:   . Fear of Current or Ex-Partner:   . Emotionally Abused:   . Physically Abused:   . Sexually Abused:      Constitutional: Denies fever, malaise, fatigue, headache or abrupt weight changes.  Respiratory: Denies difficulty breathing, shortness of breath, cough or sputum production.     Cardiovascular: Denies chest pain, chest tightness, palpitations or swelling in the hands or feet.  Gastrointestinal: Pt reports periumbilical and RLQ abdominal pain, loose stools. Denies bloating, constipation, diarrhea or blood in the stool.  Skin: Denies redness, rashes, lesions or ulcercations.    No other specific complaints in a complete review of systems (except as listed in HPI above).  Objective:   Physical Exam   BP (!) 134/92   Pulse 83   Temp 98.5 F (36.9 C) (Temporal)   Wt 196 lb (88.9 kg)   SpO2 98%   BMI 28.94 kg/m   Wt Readings from Last 3 Encounters:  02/21/19 190 lb (86.2 kg)  02/11/19 189 lb (85.7 kg)  11/29/18 185 lb (83.9 kg)    General: Appears his stated age, well developed, well nourished in NAD. Skin: Warm, dry  and intact. No rashes  ulcerations noted. Cardiovascular: Normal rate and rhythm. S1,S2 noted.  No murmur, rubs or gallops noted. Pulmonary/Chest: Normal effort and positive vesicular breath sounds. No respiratory distress. No wheezes, rales or ronchi noted.  Abdomen: Soft and tender over the umbilicus and RLQ. Hyopactive bowel sounds. No distention or masses noted.  Neurological: Alert and oriented.   BMET    Component Value Date/Time   NA 141 02/21/2019 1633   K 4.9 02/21/2019 1633   CL 105 02/21/2019 1633   CO2 26 02/21/2019 1633   GLUCOSE 87 02/21/2019 1633   BUN 15 02/21/2019 1633   CREATININE 0.90 02/21/2019 1633   CALCIUM 9.8 02/21/2019 1633   GFRNONAA NOT CALCULATED 06/26/2007 1457   GFRAA  06/26/2007 1457    NOT CALCULATED        The eGFR has been calculated using the MDRD equation. This calculation has not been validated in all clinical    Lipid Panel     Component Value Date/Time   CHOL 202 (H) 02/21/2019 1633   TRIG 188 (H) 02/21/2019 1633   HDL 30 (L) 02/21/2019 1633   CHOLHDL 6.7 (H) 02/21/2019 1633   LDLCALC 140 (H) 02/21/2019 1633    CBC    Component Value Date/Time   WBC 8.4 02/21/2019 1633   RBC 4.63 02/21/2019 1633   HGB 15.0 02/21/2019 1633   HCT 42.2 02/21/2019 1633   PLT 320 02/21/2019 1633   MCV 91.1 02/21/2019 1633   MCH 32.4 02/21/2019 1633   MCHC 35.5 02/21/2019 1633   RDW 13.5 02/21/2019 1633   LYMPHSABS 1.7 06/26/2007 1457   MONOABS 0.7 06/26/2007 1457   EOSABS 0.0 06/26/2007 1457   BASOSABS 0.1 06/26/2007 1457    Hgb A1C No results found for: HGBA1C         Assessment & Plan:   RLQ, Periumbilical Pain, Loose Stools:  Stat CBC and CMET Stat CT Abdomen with contrast r/o appendicitis  Will follow up after CT scan, return precautions discussed Webb Silversmith, NP This visit occurred during the SARS-CoV-2 public health emergency.  Safety protocols were in place, including screening questions prior to the visit, additional  usage of staff PPE, and extensive cleaning of exam room while observing appropriate contact time as indicated for disinfecting solutions.

## 2019-05-22 NOTE — Patient Instructions (Signed)

## 2019-05-22 NOTE — Addendum Note (Signed)
Addended by: Lorre Munroe on: 05/22/2019 01:40 PM   Modules accepted: Orders

## 2019-07-05 ENCOUNTER — Ambulatory Visit: Payer: BC Managed Care – PPO | Attending: Internal Medicine

## 2019-07-05 ENCOUNTER — Other Ambulatory Visit: Payer: Self-pay

## 2019-07-05 DIAGNOSIS — Z23 Encounter for immunization: Secondary | ICD-10-CM

## 2019-07-05 NOTE — Progress Notes (Signed)
   Covid-19 Vaccination Clinic  Name:  Joel Andersen    MRN: [id number] DOB: 1990-11-01  07/05/2019  [name] was observed post Covid-19 immunization for 15 minutes without incident. He was provided with Vaccine Information Sheet and instruction to access the V-Safe system.   [name] was instructed to call 911 with any severe reactions post vaccine: Marland Kitchen Difficulty breathing  . Swelling of face and throat  . A fast heartbeat  . A bad rash all over body  . Dizziness and weakness   Immunizations Administered    Name Date Dose VIS Date Route   Pfizer COVID-19 Vaccine 07/05/2019  8:55 AM 0.3 mL 04/02/2018 Intramuscular   Manufacturer: [employer], [employer]   Lot: [id number]   NDC: [address]-9

## 2019-07-18 ENCOUNTER — Other Ambulatory Visit: Payer: Self-pay | Admitting: Internal Medicine

## 2019-07-18 MED ORDER — HYDROXYZINE HCL 10 MG PO TABS: 10.0000 mg | ORAL_TABLET | Freq: Every day | ORAL | 0 refills | Status: DC | PRN

## 2019-07-18 NOTE — Telephone Encounter (Signed)
Last filled 09/25/2018... please advise  

## 2019-07-25 ENCOUNTER — Ambulatory Visit: Payer: BC Managed Care – PPO | Attending: Internal Medicine

## 2019-07-25 DIAGNOSIS — Z23 Encounter for immunization: Secondary | ICD-10-CM

## 2019-07-25 NOTE — Progress Notes (Signed)
   Covid-19 Vaccination Clinic  Name:  Joel Andersen    MRN: [id number] DOB: 11-05-1990  07/25/2019  Mr. Joel Andersen was observed post Covid-19 immunization for 15 minutes without incident. He was provided with Vaccine Information Sheet and instruction to access the V-Safe system.   Mr. Joel Andersen was instructed to call 911 with any severe reactions post vaccine: Marland Kitchen Difficulty breathing  . Swelling of face and throat  . A fast heartbeat  . A bad rash all over body  . Dizziness and weakness   Immunizations Administered    Name Date Dose VIS Date Route   Pfizer COVID-19 Vaccine 07/25/2019  8:47 AM 0.3 mL 04/02/2018 Intramuscular   Manufacturer: [employer], [employer]   Lot: [id number]   NDC: [address]-1

## 2019-07-26 ENCOUNTER — Ambulatory Visit: Payer: Self-pay

## 2019-08-10 ENCOUNTER — Other Ambulatory Visit: Payer: Self-pay | Admitting: Internal Medicine

## 2019-08-13 NOTE — Telephone Encounter (Signed)
Requesting for 90 day supply, please advise if appropriate

## 2019-11-11 ENCOUNTER — Ambulatory Visit: Payer: BC Managed Care – PPO | Admitting: Internal Medicine

## 2019-11-11 NOTE — Progress Notes (Deleted)
Subjective:    Patient ID: Joel Andersen, male    DOB: 1990-07-24, 29 y.o.   MRN: [id number]  HPI  Pt presents to the clinic today with c/o an insect bite.   Review of Systems      Past Medical History:  Diagnosis Date  . Anxiety     Current Outpatient Medications  Medication Sig Dispense Refill  . albuterol (VENTOLIN HFA) 108 (90 Base) MCG/ACT inhaler Inhale 2 puffs into the lungs every 6 (six) hours as needed for wheezing or shortness of breath. 18 g 0  . ciprofloxacin (CIPRO) 500 MG tablet Take 1 tablet (500 mg total) by mouth 2 (two) times daily. 14 tablet 0  . hydrOXYzine (ATARAX/VISTARIL) 10 MG tablet TAKE 1 TABLET BY MOUTH EVERY DAY AS NEEDED 90 tablet 1  . metroNIDAZOLE (FLAGYL) 500 MG tablet Take 1 tablet (500 mg total) by mouth 3 (three) times daily. 21 tablet 0  . venlafaxine XR (EFFEXOR-XR) 150 MG 24 hr capsule TAKE 1 CAPSULE (150 MG TOTAL) BY MOUTH DAILY WITH BREAKFAST. 90 capsule 2   No current facility-administered medications for this visit.    Allergies  Allergen Reactions  . Hydrocodone-Acetaminophen Rash    Family History  Problem Relation Age of Onset  . Diabetes Mother   . Hypertension Mother   . Diabetes Maternal Grandmother   . Hypertension Maternal Grandmother     Social History   Socioeconomic History  . Marital status: Married    Spouse name: Not on file  . Number of children: Not on file  . Years of education: Not on file  . Highest education level: Not on file  Occupational History  . Not on file  Tobacco Use  . Smoking status: Never Smoker  . Smokeless tobacco: Current User    Types: Chew  Substance and Sexual Activity  . Alcohol use: Yes    Comment: rare  . Drug use: Never  . Sexual activity: Not on file  Other Topics Concern  . Not on file  Social History Narrative  . Not on file   Social Determinants of Health   Financial Resource Strain:   . Difficulty of Paying Living Expenses: Not on file  Food Insecurity:   .  Worried About Charity fundraiser in the Last Year: Not on file  . Ran Out of Food in the Last Year: Not on file  Transportation Needs:   . Lack of Transportation (Medical): Not on file  . Lack of Transportation (Non-Medical): Not on file  Physical Activity:   . Days of Exercise per Week: Not on file  . Minutes of Exercise per Session: Not on file  Stress:   . Feeling of Stress : Not on file  Social Connections:   . Frequency of Communication with Friends and Family: Not on file  . Frequency of Social Gatherings with Friends and Family: Not on file  . Attends Religious Services: Not on file  . Active Member of Clubs or Organizations: Not on file  . Attends Archivist Meetings: Not on file  . Marital Status: Not on file  Intimate Partner Violence:   . Fear of Current or Ex-Partner: Not on file  . Emotionally Abused: Not on file  . Physically Abused: Not on file  . Sexually Abused: Not on file     Constitutional: Denies fever, malaise, fatigue, headache or abrupt weight changes.  HEENT: Denies eye pain, eye redness, ear pain, ringing in the ears, wax buildup,  runny nose, nasal congestion, bloody nose, or sore throat. Respiratory: Denies difficulty breathing, shortness of breath, cough or sputum production.   Cardiovascular: Denies chest pain, chest tightness, palpitations or swelling in the hands or feet.  Gastrointestinal: Denies abdominal pain, bloating, constipation, diarrhea or blood in the stool.  GU: Denies urgency, frequency, pain with urination, burning sensation, blood in urine, odor or discharge. Musculoskeletal: Denies decrease in range of motion, difficulty with gait, muscle pain or joint pain and swelling.  Skin: Denies redness, rashes, lesions or ulcercations.  Neurological: Denies dizziness, difficulty with memory, difficulty with speech or problems with balance and coordination.  Psych: Denies anxiety, depression, SI/HI.  No other specific complaints in a  complete review of systems (except as listed in HPI above).  Objective:   Physical Exam   There were no vitals taken for this visit. Wt Readings from Last 3 Encounters:  05/22/19 196 lb (88.9 kg)  02/21/19 190 lb (86.2 kg)  02/11/19 189 lb (85.7 kg)    General: Appears their stated age, well developed, well nourished in NAD. Skin: Warm, dry and intact. No rashes, lesions or ulcerations noted. HEENT: Head: normal shape and size; Eyes: sclera white, no icterus, conjunctiva pink, PERRLA and EOMs intact; Ears: Tm's gray and intact, normal light reflex; Nose: mucosa pink and moist, septum midline; Throat/Mouth: Teeth present, mucosa pink and moist, no exudate, lesions or ulcerations noted.  Neck:  Neck supple, trachea midline. No masses, lumps or thyromegaly present.  Cardiovascular: Normal rate and rhythm. S1,S2 noted.  No murmur, rubs or gallops noted. No JVD or BLE edema. No carotid bruits noted. Pulmonary/Chest: Normal effort and positive vesicular breath sounds. No respiratory distress. No wheezes, rales or ronchi noted.  Abdomen: Soft and nontender. Normal bowel sounds. No distention or masses noted. Liver, spleen and kidneys non palpable. Musculoskeletal: Normal range of motion. No signs of joint swelling. No difficulty with gait.  Neurological: Alert and oriented. Cranial nerves II-XII grossly intact. Coordination normal.  Psychiatric: Mood and affect normal. Behavior is normal. Judgment and thought content normal.   EKG:  BMET    Component Value Date/Time   NA 138 05/22/2019 1048   K 3.7 05/22/2019 1048   CL 103 05/22/2019 1048   CO2 28 05/22/2019 1048   GLUCOSE 93 05/22/2019 1048   BUN 9 05/22/2019 1048   CREATININE 0.84 05/22/2019 1048   CREATININE 0.90 02/21/2019 1633   CALCIUM 9.1 05/22/2019 1048   GFRNONAA NOT CALCULATED 06/26/2007 1457   GFRAA  06/26/2007 1457    NOT CALCULATED        The eGFR has been calculated using the MDRD equation. This calculation has not  been validated in all clinical    Lipid Panel     Component Value Date/Time   CHOL 202 (H) 02/21/2019 1633   TRIG 188 (H) 02/21/2019 1633   HDL 30 (L) 02/21/2019 1633   CHOLHDL 6.7 (H) 02/21/2019 1633   LDLCALC 140 (H) 02/21/2019 1633    CBC    Component Value Date/Time   WBC 6.2 05/22/2019 1048   RBC 5.03 05/22/2019 1048   HGB 15.9 05/22/2019 1048   HCT 46.3 05/22/2019 1048   PLT 311.0 05/22/2019 1048   MCV 92.0 05/22/2019 1048   MCH 32.4 02/21/2019 1633   MCHC 34.4 05/22/2019 1048   RDW 13.8 05/22/2019 1048   LYMPHSABS 1.7 06/26/2007 1457   MONOABS 0.7 06/26/2007 1457   EOSABS 0.0 06/26/2007 1457   BASOSABS 0.1 06/26/2007 1457  Hgb A1C No results found for: HGBA1C         Assessment & Plan:     Webb Silversmith, NP This visit occurred during the SARS-CoV-2 public health emergency.  Safety protocols were in place, including screening questions prior to the visit, additional usage of staff PPE, and extensive cleaning of exam room while observing appropriate contact time as indicated for disinfecting solutions.

## 2020-01-12 ENCOUNTER — Other Ambulatory Visit: Payer: Self-pay | Admitting: Internal Medicine

## 2020-02-23 ENCOUNTER — Encounter: Payer: BC Managed Care – PPO | Admitting: Internal Medicine

## 2020-03-09 ENCOUNTER — Encounter: Payer: BC Managed Care – PPO | Admitting: Internal Medicine

## 2020-04-26 ENCOUNTER — Ambulatory Visit (INDEPENDENT_AMBULATORY_CARE_PROVIDER_SITE_OTHER): Payer: BC Managed Care – PPO | Admitting: Family Medicine

## 2020-04-26 ENCOUNTER — Other Ambulatory Visit: Payer: Self-pay

## 2020-04-26 ENCOUNTER — Encounter: Payer: Self-pay | Admitting: Family Medicine

## 2020-04-26 DIAGNOSIS — R21 Rash and other nonspecific skin eruption: Secondary | ICD-10-CM

## 2020-04-26 MED ORDER — PREDNISONE 10 MG PO TABS: ORAL_TABLET | ORAL | 0 refills | Status: DC

## 2020-04-26 MED ORDER — TRIAMCINOLONE ACETONIDE 0.5 % EX CREA: 1.0000 "application " | TOPICAL_CREAM | Freq: Two times a day (BID) | CUTANEOUS | 1 refills | Status: DC

## 2020-04-26 NOTE — Patient Instructions (Signed)
Try using TAC cream twice a day.  If not better, then start prednisone with food.  Update me as needed.  Take care.  Glad to see you.

## 2020-04-26 NOTE — Progress Notes (Signed)
This visit occurred during the SARS-CoV-2 public health emergency.  Safety protocols were in place, including screening questions prior to the visit, additional usage of staff PPE, and extensive cleaning of exam room while observing appropriate contact time as indicated for disinfecting solutions.  Rash.  Started on R elbow, then R forearm.  Noted in the last 5 days.  Now on R thigh and L forearm.  Initially itched, less as the rash has been present for a while.  Saw some small blisters initially on some of the spots.  No FCNAVD.  He doesn't fell unwell.  No new chemicals at work, he checked paperwork at work about exposures.  No other known exposure to poison ivy, etc.  No oral lesions.    Took hydroxyzine last week but not in the meantime.  Taking benadryl BID for the last few days.    No new soaps or triggers at home.  No one else with similar at home.    Meds, vitals, and allergies reviewed.   ROS: Per HPI unless specifically indicated in ROS section   GEN: nad, alert and oriented HEENT: NCAT NECK: supple w/o LA CV: rrr.  PULM: ctab, no inc wob ABD: soft, +bs EXT: no edema SKIN: He has a patchy maculopapular rash that is not dermatomal.  It is noted on the right thigh left forearm and right forearm.  No ulceration.  It blanches.  He reports tingling locally but no burning.  No vesicles.  No streaking erythema. Normal skin on the palms.

## 2020-04-28 DIAGNOSIS — R21 Rash and other nonspecific skin eruption: Secondary | ICD-10-CM | POA: Insufficient documentation

## 2020-04-28 NOTE — Assessment & Plan Note (Signed)
Unclear source.  It looks like he has a benign inflammatory but not infectious process.  Discussed options Try using TAC cream twice a day.  If not better, then start prednisone with food.  Update me as needed.  Routine steroid cautions discussed with patient.  He agrees with plan.  Okay for outpatient follow-up.

## 2020-05-04 ENCOUNTER — Other Ambulatory Visit: Payer: Self-pay | Admitting: Internal Medicine

## 2020-05-11 ENCOUNTER — Encounter: Payer: Self-pay | Admitting: Internal Medicine

## 2020-05-11 ENCOUNTER — Ambulatory Visit (INDEPENDENT_AMBULATORY_CARE_PROVIDER_SITE_OTHER): Payer: BC Managed Care – PPO | Admitting: Internal Medicine

## 2020-05-11 ENCOUNTER — Other Ambulatory Visit: Payer: Self-pay

## 2020-05-11 VITALS — BP 118/78 | HR 100 | Temp 98.2°F | Ht 69.0 in | Wt 196.0 lb

## 2020-05-11 DIAGNOSIS — Z0001 Encounter for general adult medical examination with abnormal findings: Secondary | ICD-10-CM

## 2020-05-11 DIAGNOSIS — K219 Gastro-esophageal reflux disease without esophagitis: Secondary | ICD-10-CM | POA: Diagnosis not present

## 2020-05-11 DIAGNOSIS — Z114 Encounter for screening for human immunodeficiency virus [HIV]: Secondary | ICD-10-CM

## 2020-05-11 DIAGNOSIS — F411 Generalized anxiety disorder: Secondary | ICD-10-CM | POA: Diagnosis not present

## 2020-05-11 DIAGNOSIS — Z0000 Encounter for general adult medical examination without abnormal findings: Secondary | ICD-10-CM | POA: Diagnosis not present

## 2020-05-11 DIAGNOSIS — Z1159 Encounter for screening for other viral diseases: Secondary | ICD-10-CM | POA: Diagnosis not present

## 2020-05-11 NOTE — Patient Instructions (Signed)

## 2020-05-11 NOTE — Assessment & Plan Note (Signed)
Stable on Venlafaxine and Hydroxyzine Support offered today Will monitor

## 2020-05-11 NOTE — Progress Notes (Signed)
Subjective:    Patient ID: Joel Andersen, male    DOB: 1990/03/11, 30 y.o.   MRN: [id number]  HPI  Patient presents the clinic today for his annual exam.  GAD: Managed with Venlafaxine and Hydroxyzine.  He is not currently seeing a therapist.  He denies depression, SI/HI.  Flu: never Tetanus: 09/2017 Covid:Pfizer x2 Dentist: as needed  Diet: He does eat meat. He consumes fruits and veggies. He does eat some fried foods. He drinks mostly water and soda. Exercise: None  Review of Systems      Past Medical History:  Diagnosis Date  . Anxiety     Current Outpatient Medications  Medication Sig Dispense Refill  . albuterol (VENTOLIN HFA) 108 (90 Base) MCG/ACT inhaler Inhale 2 puffs into the lungs every 6 (six) hours as needed for wheezing or shortness of breath. 18 g 0  . hydrOXYzine (ATARAX/VISTARIL) 10 MG tablet TAKE 1 TABLET BY MOUTH EVERY DAY AS NEEDED 90 tablet 1  . predniSONE (DELTASONE) 10 MG tablet Take 2 a day for 5 days, then 1 a day for 5 days, with food. Don't take with aleve/ibuprofen. 15 tablet 0  . triamcinolone cream (KENALOG) 0.5 % Apply 1 application topically 2 (two) times daily. 30 g 1  . venlafaxine XR (EFFEXOR-XR) 150 MG 24 hr capsule TAKE 1 CAPSULE (150 MG TOTAL) BY MOUTH DAILY WITH BREAKFAST. 90 capsule 0   No current facility-administered medications for this visit.    Allergies  Allergen Reactions  . Hydrocodone-Acetaminophen Rash    Family History  Problem Relation Age of Onset  . Diabetes Mother   . Hypertension Mother   . Diabetes Maternal Grandmother   . Hypertension Maternal Grandmother     Social History   Socioeconomic History  . Marital status: Married    Spouse name: Not on file  . Number of children: Not on file  . Years of education: Not on file  . Highest education level: Not on file  Occupational History  . Not on file  Tobacco Use  . Smoking status: Never Smoker  . Smokeless tobacco: Current User    Types: Chew   Substance and Sexual Activity  . Alcohol use: Yes    Comment: rare  . Drug use: Never  . Sexual activity: Not on file  Other Topics Concern  . Not on file  Social History Narrative  . Not on file   Social Determinants of Health   Financial Resource Strain: Not on file  Food Insecurity: Not on file  Transportation Needs: Not on file  Physical Activity: Not on file  Stress: Not on file  Social Connections: Not on file  Intimate Partner Violence: Not on file     Constitutional: Denies fever, malaise, fatigue, headache or abrupt weight changes.  HEENT: Denies eye pain, eye redness, ear pain, ringing in the ears, wax buildup, runny nose, nasal congestion, bloody nose, or sore throat. Respiratory: Denies difficulty breathing, shortness of breath, cough or sputum production.   Cardiovascular: Denies chest pain, chest tightness, palpitations or swelling in the hands or feet.  Gastrointestinal: Pt reports intermittent reflux. Denies abdominal pain, bloating, constipation, diarrhea or blood in the stool.  GU: Denies urgency, frequency, pain with urination, burning sensation, blood in urine, odor or discharge. Musculoskeletal: Denies decrease in range of motion, difficulty with gait, muscle pain or joint pain and swelling.  Skin: Denies redness, rashes, lesions or ulcercations.  Neurological: Denies dizziness, difficulty with memory, difficulty with speech or problems with  balance and coordination.  Psych: Patient has a history of anxiety.  Denies depression, SI/HI.  No other specific complaints in a complete review of systems (except as listed in HPI above).  Objective:   Physical Exam  BP 118/78   Pulse 100   Temp 98.2 F (36.8 C) (Temporal)   Ht $R'5\' 9"'Yq$  (1.753 m)   Wt 196 lb (88.9 kg)   SpO2 98%   BMI 28.94 kg/m   Wt Readings from Last 3 Encounters:  04/26/20 198 lb (89.8 kg)  05/22/19 196 lb (88.9 kg)  02/21/19 190 lb (86.2 kg)    General: Appears his stated age, well  developed, well nourished in NAD. Skin: Warm, dry and intact. No rashes noted. HEENT: Head: normal shape and size; Eyes: sclera white, no icterus, conjunctiva pink, PERRLA and EOMs intact;  Neck:  Neck supple, trachea midline. No masses, lumps or thyromegaly present.  Cardiovascular: Normal rate and rhythm. S1,S2 noted.  No murmur, rubs or gallops noted. No JVD or BLE edema. Pulmonary/Chest: Normal effort and positive vesicular breath sounds. No respiratory distress. No wheezes, rales or ronchi noted.  Abdomen: Soft and nontender. Normal bowel sounds. No distention or masses noted. Liver, spleen and kidneys non palpable. Musculoskeletal: Strength 5/5 BUE/BLE. No difficulty with gait.  Neurological: Alert and oriented. Cranial nerves II-XII grossly intact. Coordination normal.  Psychiatric: Mood and affect normal. Behavior is normal. Judgment and thought content normal.    BMET    Component Value Date/Time   NA 138 05/22/2019 1048   K 3.7 05/22/2019 1048   CL 103 05/22/2019 1048   CO2 28 05/22/2019 1048   GLUCOSE 93 05/22/2019 1048   BUN 9 05/22/2019 1048   CREATININE 0.84 05/22/2019 1048   CREATININE 0.90 02/21/2019 1633   CALCIUM 9.1 05/22/2019 1048   GFRNONAA NOT CALCULATED 06/26/2007 1457   GFRAA  06/26/2007 1457    NOT CALCULATED        The eGFR has been calculated using the MDRD equation. This calculation has not been validated in all clinical    Lipid Panel     Component Value Date/Time   CHOL 202 (H) 02/21/2019 1633   TRIG 188 (H) 02/21/2019 1633   HDL 30 (L) 02/21/2019 1633   CHOLHDL 6.7 (H) 02/21/2019 1633   LDLCALC 140 (H) 02/21/2019 1633    CBC    Component Value Date/Time   WBC 6.2 05/22/2019 1048   RBC 5.03 05/22/2019 1048   HGB 15.9 05/22/2019 1048   HCT 46.3 05/22/2019 1048   PLT 311.0 05/22/2019 1048   MCV 92.0 05/22/2019 1048   MCH 32.4 02/21/2019 1633   MCHC 34.4 05/22/2019 1048   RDW 13.8 05/22/2019 1048   LYMPHSABS 1.7 06/26/2007 1457    MONOABS 0.7 06/26/2007 1457   EOSABS 0.0 06/26/2007 1457   BASOSABS 0.1 06/26/2007 1457    Hgb A1C No results found for: HGBA1C         Assessment & Plan:   Preventative Health Maintenance:  He declines flu shot Tetanus UTD Encouraged him to get his COVID booster Encouraged him to consume a balanced diet exercise regimen Advised him to see dentist annually We will check CBC, c-Met, lipid, A1c, HIV and hep C today  RTC in 1 year, sooner if needed Webb Silversmith, NP This visit occurred during the SARS-CoV-2 public health emergency.  Safety protocols were in place, including screening questions prior to the visit, additional usage of staff PPE, and extensive cleaning of exam room while  observing appropriate contact time as indicated for disinfecting solutions.

## 2020-05-11 NOTE — Assessment & Plan Note (Signed)
Will trial Prilosec OTC or generic equivalent x 2 weeks, the stop If symptoms return, will send in RX for Omeprazole 20 mg daily Encouraged him to clean up his diet and exercise for weight loss

## 2020-05-12 LAB — LIPID PANEL
Cholesterol: 209 mg/dL — ABNORMAL HIGH (ref 0–200)
HDL: 34.1 mg/dL — ABNORMAL LOW (ref >39.00)
NonHDL: 175.13
Total CHOL/HDL Ratio: 6
Triglycerides: 219 mg/dL — ABNORMAL HIGH (ref 0.0–149.0)
VLDL: 43.8 mg/dL — ABNORMAL HIGH (ref 0.0–40.0)

## 2020-05-12 LAB — CBC
HCT: 46.6 % (ref 39.0–52.0)
Hemoglobin: 15.8 g/dL (ref 13.0–17.0)
MCHC: 34 g/dL (ref 30.0–36.0)
MCV: 92 fl (ref 78.0–100.0)
Platelets: 315 10*3/uL (ref 150.0–400.0)
RBC: 5.06 Mil/uL (ref 4.22–5.81)
RDW: 13.8 % (ref 11.5–15.5)
WBC: 7.9 10*3/uL (ref 4.0–10.5)

## 2020-05-12 LAB — COMPREHENSIVE METABOLIC PANEL
ALT: 31 U/L (ref 0–53)
AST: 19 U/L (ref 0–37)
Albumin: 4.9 g/dL (ref 3.5–5.2)
Alkaline Phosphatase: 81 U/L (ref 39–117)
BUN: 7 mg/dL (ref 6–23)
CO2: 28 mEq/L (ref 19–32)
Calcium: 9.7 mg/dL (ref 8.4–10.5)
Chloride: 103 mEq/L (ref 96–112)
Creatinine, Ser: 0.89 mg/dL (ref 0.40–1.50)
GFR: 114.86 mL/min (ref >60.00)
Glucose, Bld: 86 mg/dL (ref 70–99)
Potassium: 4.5 mEq/L (ref 3.5–5.1)
Sodium: 139 mEq/L (ref 135–145)
Total Bilirubin: 0.5 mg/dL (ref 0.2–1.2)
Total Protein: 7.4 g/dL (ref 6.0–8.3)

## 2020-05-12 LAB — HEPATITIS C ANTIBODY
Hepatitis C Ab: NONREACTIVE
SIGNAL TO CUT-OFF: 0 (ref <1.00)

## 2020-05-12 LAB — LDL CHOLESTEROL, DIRECT: Direct LDL: 154 mg/dL

## 2020-05-12 LAB — HIV ANTIBODY (ROUTINE TESTING W REFLEX): HIV 1&2 Ab, 4th Generation: NONREACTIVE

## 2020-05-12 LAB — HEMOGLOBIN A1C: Hgb A1c MFr Bld: 5.1 % (ref 4.6–6.5)

## 2020-07-15 ENCOUNTER — Encounter: Payer: Self-pay | Admitting: Internal Medicine

## 2020-07-15 ENCOUNTER — Other Ambulatory Visit: Payer: Self-pay

## 2020-07-15 ENCOUNTER — Ambulatory Visit (INDEPENDENT_AMBULATORY_CARE_PROVIDER_SITE_OTHER): Payer: BC Managed Care – PPO | Admitting: Internal Medicine

## 2020-07-15 VITALS — BP 124/81 | HR 105 | Temp 98.7°F | Resp 17 | Ht 69.0 in | Wt 199.2 lb

## 2020-07-15 DIAGNOSIS — E663 Overweight: Secondary | ICD-10-CM

## 2020-07-15 DIAGNOSIS — J329 Chronic sinusitis, unspecified: Secondary | ICD-10-CM

## 2020-07-15 DIAGNOSIS — E782 Mixed hyperlipidemia: Secondary | ICD-10-CM | POA: Diagnosis not present

## 2020-07-15 DIAGNOSIS — B9789 Other viral agents as the cause of diseases classified elsewhere: Secondary | ICD-10-CM

## 2020-07-15 DIAGNOSIS — E785 Hyperlipidemia, unspecified: Secondary | ICD-10-CM | POA: Insufficient documentation

## 2020-07-15 DIAGNOSIS — E7800 Pure hypercholesterolemia, unspecified: Secondary | ICD-10-CM | POA: Diagnosis not present

## 2020-07-15 LAB — LIPID PANEL
Cholesterol: 216 mg/dL — ABNORMAL HIGH (ref <200)
HDL: 36 mg/dL — ABNORMAL LOW (ref >=40)
LDL Cholesterol (Calc): 140 mg/dL (calc) — ABNORMAL HIGH
Non-HDL Cholesterol (Calc): 180 mg/dL (calc) — ABNORMAL HIGH (ref <130)
Total CHOL/HDL Ratio: 6 (calc) — ABNORMAL HIGH (ref <5.0)
Triglycerides: 257 mg/dL — ABNORMAL HIGH (ref <150)

## 2020-07-15 MED ORDER — PREDNISONE 10 MG PO TABS: ORAL_TABLET | ORAL | 0 refills | Status: DC

## 2020-07-15 MED ORDER — AMOXICILLIN-POT CLAVULANATE 875-125 MG PO TABS: 1.0000 | ORAL_TABLET | Freq: Two times a day (BID) | ORAL | 0 refills | Status: DC

## 2020-07-15 NOTE — Patient Instructions (Signed)

## 2020-07-15 NOTE — Progress Notes (Signed)
Subjective:    Patient ID: Thelma Barge, male    DOB: [birth year], 30 y.o.   MRN: [id number]  HPI  Patient presents the clinic today with complaint of headache, nasal congestion, sore throat and cough he reports this started 5 days ago.  The headache is located at the top of his nasal bridge.  He is blowing yellow mucus out of his nose.  He denies difficulty swallowing.  The cough is productive of yellow mucus.  He denies runny nose, ear pain, or shortness of breath.  He denies fever, chills or body aches.  He was unloading hay prior to symptom onset.  He denies exposure to COVID.  He has tried Flonase OTC with minimal relief of symptoms.  He is also due to follow-up hyperlipidemia.  His last LDL was 154, triglycerides 219, 05/2020.  He was advised to take Fish Oil OTC, consume a low saturated fat diet and increase aerobic exercise.  He is due for repeat lipid panel today.  Review of Systems     Past Medical History:  Diagnosis Date   Anxiety     Current Outpatient Medications  Medication Sig Dispense Refill   albuterol (VENTOLIN HFA) 108 (90 Base) MCG/ACT inhaler Inhale 2 puffs into the lungs every 6 (six) hours as needed for wheezing or shortness of breath. 18 g 0   hydrOXYzine (ATARAX/VISTARIL) 10 MG tablet TAKE 1 TABLET BY MOUTH EVERY DAY AS NEEDED 90 tablet 1   venlafaxine XR (EFFEXOR-XR) 150 MG 24 hr capsule TAKE 1 CAPSULE (150 MG TOTAL) BY MOUTH DAILY WITH BREAKFAST. 90 capsule 0   triamcinolone cream (KENALOG) 0.5 % Apply 1 application topically 2 (two) times daily. (Patient not taking: Reported on 07/15/2020) 30 g 1   No current facility-administered medications for this visit.    Allergies  Allergen Reactions   Hydrocodone-Acetaminophen Rash    Family History  Problem Relation Age of Onset   Diabetes Mother    Hypertension Mother    Diabetes Maternal Grandmother    Hypertension Maternal Grandmother     Social History   Socioeconomic History   Marital status:  Married    Spouse name: Not on file   Number of children: Not on file   Years of education: Not on file   Highest education level: Not on file  Occupational History   Not on file  Tobacco Use   Smoking status: Never   Smokeless tobacco: Current    Types: Chew  Vaping Use   Vaping Use: Never used  Substance and Sexual Activity   Alcohol use: Yes    Comment: rare   Drug use: Never   Sexual activity: Not on file  Other Topics Concern   Not on file  Social History Narrative   Not on file   Social Determinants of Health   Financial Resource Strain: Not on file  Food Insecurity: Not on file  Transportation Needs: Not on file  Physical Activity: Not on file  Stress: Not on file  Social Connections: Not on file  Intimate Partner Violence: Not on file     Constitutional: Patient reports headache.  Denies fever, malaise, fatigue, or abrupt weight changes.  HEENT: Patient reports nasal congestion and sore throat.  Denies eye pain, eye redness, ear pain, ringing in the ears, wax buildup, runny nose, bloody nose. Respiratory: Patient reports cough and sputum production.  Denies difficulty breathing, shortness of breath.   Cardiovascular: Denies chest pain, chest tightness, palpitations or swelling in the  hands or feet.  Gastrointestinal: Denies abdominal pain, bloating, constipation, diarrhea or blood in the stool.   No other specific complaints in a complete review of systems (except as listed in HPI above).  Objective:   Physical Exam   BP 124/81 (BP Location: Right Arm, Patient Position: Sitting, Cuff Size: Normal)   Pulse (!) 105   Temp 98.7 F (37.1 C) (Temporal)   Resp 17   Ht 5' 9" (1.753 m)   Wt 199 lb 3.2 oz (90.4 kg)   SpO2 100%   BMI 29.42 kg/m  Wt Readings from Last 3 Encounters:  07/15/20 199 lb 3.2 oz (90.4 kg)  05/11/20 196 lb (88.9 kg)  04/26/20 198 lb (89.8 kg)    General: Appears his stated age, well developed, well nourished in NAD. HEENT: Head:  normal shape and size, ethmoidal sinus tenderness noted; Eyes: sclera white and EOMs intact; Ears: Tm's gray and intact, normal light reflex; Nose: mucosa erythematous and moist, septum midline; Throat/Mouth: Teeth present, mucosa pink and moist, no exudate, lesions or ulcerations noted.  Neck: No adenopathy noted. Cardiovascular: Tachycardic with rhythm. S1,S2 noted.  No murmur, rubs or gallops noted.  Pulmonary/Chest: Normal effort and positive vesicular breath sounds. No respiratory distress. No wheezes, rales or ronchi noted.  Neurological: Alert and oriented.    BMET    Component Value Date/Time   NA 139 05/11/2020 1445   K 4.5 05/11/2020 1445   CL 103 05/11/2020 1445   CO2 28 05/11/2020 1445   GLUCOSE 86 05/11/2020 1445   BUN 7 05/11/2020 1445   CREATININE 0.89 05/11/2020 1445   CREATININE 0.90 02/21/2019 1633   CALCIUM 9.7 05/11/2020 1445   GFRNONAA NOT CALCULATED 06/26/2007 1457   GFRAA  06/26/2007 1457    NOT CALCULATED        The eGFR has been calculated using the MDRD equation. This calculation has not been validated in all clinical    Lipid Panel     Component Value Date/Time   CHOL 209 (H) 05/11/2020 1445   TRIG 219.0 (H) 05/11/2020 1445   HDL 34.10 (L) 05/11/2020 1445   CHOLHDL 6 05/11/2020 1445   VLDL 43.8 (H) 05/11/2020 1445   LDLCALC 140 (H) 02/21/2019 1633    CBC    Component Value Date/Time   WBC 7.9 05/11/2020 1445   RBC 5.06 05/11/2020 1445   HGB 15.8 05/11/2020 1445   HCT 46.6 05/11/2020 1445   PLT 315.0 05/11/2020 1445   MCV 92.0 05/11/2020 1445   MCH 32.4 02/21/2019 1633   MCHC 34.0 05/11/2020 1445   RDW 13.8 05/11/2020 1445   LYMPHSABS 1.7 06/26/2007 1457   MONOABS 0.7 06/26/2007 1457   EOSABS 0.0 06/26/2007 1457   BASOSABS 0.1 06/26/2007 1457    Hgb A1C Lab Results  Component Value Date   HGBA1C 5.1 05/11/2020           Assessment & Plan:   Viral Sinusitis:  Rx for Pred taper x6 days Will give Rx for Augmentin 875-125  mg p.o. twice daily x10 days if symptoms persist or worsen in the next few days Hold Flonase while on Prednisone Consider taking Zyrtec daily Work note provided  Return precautions discussed Webb Silversmith, NP This visit occurred during the SARS-CoV-2 public health emergency.  Safety protocols were in place, including screening questions prior to the visit, additional usage of staff PPE, and extensive cleaning of exam room while observing appropriate contact time as indicated for disinfecting solutions.

## 2020-07-15 NOTE — Assessment & Plan Note (Signed)
Lipid profile today Will consider statin therapy pending labs Encouraged him to consume a low saturated diet and exercise for weight loss

## 2020-07-22 ENCOUNTER — Ambulatory Visit: Payer: Self-pay | Admitting: Internal Medicine

## 2020-08-27 ENCOUNTER — Other Ambulatory Visit: Payer: Self-pay | Admitting: Internal Medicine

## 2020-10-09 ENCOUNTER — Other Ambulatory Visit: Payer: Self-pay | Admitting: Internal Medicine

## 2020-10-09 NOTE — Telephone Encounter (Signed)
Requested Prescriptions  Pending Prescriptions Disp Refills  . hydrOXYzine (ATARAX/VISTARIL) 10 MG tablet [Pharmacy Med Name: HYDROXYZINE HCL 10 MG TABLET] 90 tablet 1    Sig: TAKE 1 TABLET BY MOUTH EVERY DAY AS NEEDED     Ear, Nose, and Throat:  Antihistamines Passed - 10/09/2020  7:05 AM      Passed - Valid encounter within last 12 months    Recent Outpatient Visits          2 months ago Mixed hyperlipidemia   [facility] [place], Salvadore Oxford, NP

## 2020-11-02 DIAGNOSIS — S60222A Contusion of left hand, initial encounter: Secondary | ICD-10-CM | POA: Diagnosis not present

## 2020-11-02 DIAGNOSIS — M79642 Pain in left hand: Secondary | ICD-10-CM | POA: Diagnosis not present

## 2020-11-27 ENCOUNTER — Other Ambulatory Visit: Payer: Self-pay | Admitting: Internal Medicine

## 2020-11-27 NOTE — Telephone Encounter (Signed)
Requested Prescriptions  Pending Prescriptions Disp Refills  . venlafaxine XR (EFFEXOR-XR) 150 MG 24 hr capsule [Pharmacy Med Name: VENLAFAXINE HCL ER 150 MG CAP] 90 capsule 0    Sig: TAKE 1 CAPSULE BY MOUTH DAILY WITH BREAKFAST.     Psychiatry: Antidepressants - SNRI - desvenlafaxine & venlafaxine Failed - 11/27/2020  2:39 PM      Failed - LDL in normal range and within 360 days    LDL Cholesterol (Calc)  Date Value Ref Range Status  07/15/2020 140 (H) mg/dL (calc) Final    Comment:    Reference range: <100 . Desirable range <100 mg/dL for primary prevention;   <70 mg/dL for patients with CHD or diabetic patients  with > or = 2 CHD risk factors. Marland Kitchen LDL-C is now calculated using the Martin-Hopkins  calculation, which is a validated novel method providing  better accuracy than the Friedewald equation in the  estimation of LDL-C.  Horald Pollen et al. Lenox Ahr. 3810;175(10): 2061-2068  (http://education.[facility].com/faq/FAQ164)    Direct LDL  Date Value Ref Range Status  05/11/2020 154.0 mg/dL Final    Comment:    Optimal:  <100 mg/dLNear or Above Optimal:  100-129 mg/dLBorderline High:  130-159 mg/dLHigh:  160-189 mg/dLVery High:  >190 mg/dL         Failed - Total Cholesterol in normal range and within 360 days    Cholesterol  Date Value Ref Range Status  07/15/2020 216 (H) <200 mg/dL Final         Failed - Triglycerides in normal range and within 360 days    Triglycerides  Date Value Ref Range Status  07/15/2020 257 (H) <150 mg/dL Final    Comment:    . If a non-fasting specimen was collected, consider repeat triglyceride testing on a fasting specimen if clinically indicated.  Perry Mount et al. J. of Clin. Lipidol. 2015;9:129-169. Marland Kitchen          Passed - Last BP in normal range    BP Readings from Last 1 Encounters:  07/15/20 124/81         Passed - Valid encounter within last 6 months    Recent Outpatient Visits          4 months ago Mixed hyperlipidemia    [facility] [place], Salvadore [place], [place]

## 2021-04-16 ENCOUNTER — Other Ambulatory Visit: Payer: Self-pay | Admitting: Family Medicine

## 2021-05-16 ENCOUNTER — Encounter: Payer: BC Managed Care – PPO | Admitting: Internal Medicine

## 2021-07-15 ENCOUNTER — Other Ambulatory Visit: Payer: Self-pay | Admitting: Internal Medicine

## 2021-07-15 NOTE — Telephone Encounter (Signed)
Requested medication (s) are due for refill today - yes  Requested medication (s) are on the active medication list -yes  Future visit scheduled no  Last refill: 04/18/21 #90  Notes to clinic: patient has no showed/canceled appointment, fails lab protocols- request sent for PCP review   Requested Prescriptions  Pending Prescriptions Disp Refills   venlafaxine XR (EFFEXOR-XR) 150 MG 24 hr capsule [Pharmacy Med Name: VENLAFAXINE HCL ER 150 MG CAP] 90 capsule 0    Sig: TAKE 1 CAPSULE BY MOUTH DAILY WITH BREAKFAST.     Psychiatry: Antidepressants - SNRI - desvenlafaxine & venlafaxine Failed - 07/15/2021  2:12 AM      Failed - Cr in normal range and within 360 days    Creat  Date Value Ref Range Status  02/21/2019 0.90 0.60 - 1.35 mg/dL Final   Creatinine, Ser  Date Value Ref Range Status  05/11/2020 0.89 0.40 - 1.50 mg/dL Final         Failed - Valid encounter within last 6 months    Recent Outpatient Visits           1 year ago Mixed hyperlipidemia   [facility] [place], [place], NP              Failed - Lipid Panel in normal range within the last 12 months    Cholesterol  Date Value Ref Range Status  07/15/2020 216 (H) <200 mg/dL Final   LDL Cholesterol (Calc)  Date Value Ref Range Status  07/15/2020 140 (H) mg/dL (calc) Final    Comment:    Reference range: <100 . Desirable range <100 mg/dL for primary prevention;   <70 mg/dL for patients with CHD or diabetic patients  with > or = 2 CHD risk factors. Marland Kitchen LDL-C is now calculated using the Martin-Hopkins  calculation, which is a validated novel method providing  better accuracy than the Friedewald equation in the  estimation of LDL-C.  Horald Pollen et al. Lenox Ahr. 0923;300(76): 2061-2068  (http://education.[facility].com/faq/FAQ164)    Direct LDL  Date Value Ref Range Status  05/11/2020 154.0 mg/dL Final    Comment:    Optimal:  <100 mg/dLNear or Above Optimal:  100-129 mg/dLBorderline High:   130-159 mg/dLHigh:  160-189 mg/dLVery High:  >190 mg/dL   HDL  Date Value Ref Range Status  07/15/2020 36 (L) > OR = 40 mg/dL Final   Triglycerides  Date Value Ref Range Status  07/15/2020 257 (H) <150 mg/dL Final    Comment:    . If a non-fasting specimen was collected, consider repeat triglyceride testing on a fasting specimen if clinically indicated.  Perry Mount et al. J. of Clin. Lipidol. 2015;9:129-169. Marland Kitchen          Passed - Last BP in normal range    BP Readings from Last 1 Encounters:  07/15/20 124/81            Requested Prescriptions  Pending Prescriptions Disp Refills   venlafaxine XR (EFFEXOR-XR) 150 MG 24 hr capsule [Pharmacy Med Name: VENLAFAXINE HCL ER 150 MG CAP] 90 capsule 0    Sig: TAKE 1 CAPSULE BY MOUTH DAILY WITH BREAKFAST.     Psychiatry: Antidepressants - SNRI - desvenlafaxine & venlafaxine Failed - 07/15/2021  2:12 AM      Failed - Cr in normal range and within 360 days    Creat  Date Value Ref Range Status  02/21/2019 0.90 0.60 - 1.35 mg/dL Final   Creatinine, Ser  Date Value Ref Range  Status  05/11/2020 0.89 0.40 - 1.50 mg/dL Final         Failed - Valid encounter within last 6 months    Recent Outpatient Visits           1 year ago Mixed hyperlipidemia   [facility] [place], [place] W, NP              Failed - Lipid Panel in normal range within the last 12 months    Cholesterol  Date Value Ref Range Status  07/15/2020 216 (H) <200 mg/dL Final   LDL Cholesterol (Calc)  Date Value Ref Range Status  07/15/2020 140 (H) mg/dL (calc) Final    Comment:    Reference range: <100 . Desirable range <100 mg/dL for primary prevention;   <70 mg/dL for patients with CHD or diabetic patients  with > or = 2 CHD risk factors. Marland Kitchen LDL-C is now calculated using the Martin-Hopkins  calculation, which is a validated novel method providing  better accuracy than the Friedewald equation in the  estimation of LDL-C.  Horald Pollen et al.  Lenox Ahr. 2778;242(35): 2061-2068  (http://education.[facility].com/faq/FAQ164)    Direct LDL  Date Value Ref Range Status  05/11/2020 154.0 mg/dL Final    Comment:    Optimal:  <100 mg/dLNear or Above Optimal:  100-129 mg/dLBorderline High:  130-159 mg/dLHigh:  160-189 mg/dLVery High:  >190 mg/dL   HDL  Date Value Ref Range Status  07/15/2020 36 (L) > OR = 40 mg/dL Final   Triglycerides  Date Value Ref Range Status  07/15/2020 257 (H) <150 mg/dL Final    Comment:    . If a non-fasting specimen was collected, consider repeat triglyceride testing on a fasting specimen if clinically indicated.  Perry Mount et al. J. of Clin. Lipidol. 2015;9:129-169. Marland Kitchen          Passed - Last BP in normal range    BP Readings from Last 1 Encounters:  07/15/20 124/81

## 2021-08-18 ENCOUNTER — Other Ambulatory Visit: Payer: Self-pay | Admitting: Internal Medicine

## 2021-08-18 NOTE — Telephone Encounter (Signed)
Requested medications are due for refill today.  yes  Requested medications are on the active medications list.  yes  Last refill. 04/18/2021 #90 0 refills  Future visit scheduled.   yes  Notes to clinic.  Labs are expired.    Requested Prescriptions  Pending Prescriptions Disp Refills   venlafaxine XR (EFFEXOR-XR) 150 MG 24 hr capsule [Pharmacy Med Name: VENLAFAXINE HCL ER 150 MG CAP] 90 capsule 0    Sig: TAKE 1 CAPSULE BY MOUTH DAILY WITH BREAKFAST.     Psychiatry: Antidepressants - SNRI - desvenlafaxine & venlafaxine Failed - 08/18/2021 10:34 AM      Failed - Cr in normal range and within 360 days    Creat  Date Value Ref Range Status  02/21/2019 0.90 0.60 - 1.35 mg/dL Final   Creatinine, Ser  Date Value Ref Range Status  05/11/2020 0.89 0.40 - 1.50 mg/dL Final         Failed - Valid encounter within last 6 months    Recent Outpatient Visits           1 year ago Mixed hyperlipidemia   [facility] [place], Salvadore [place], NP       Future Appointments             In 4 weeks [place], Salvadore [place], NP [facility]            Failed - Lipid Panel in normal range within the last 12 months    Cholesterol  Date Value Ref Range Status  07/15/2020 216 (H) <200 mg/dL Final   LDL Cholesterol (Calc)  Date Value Ref Range Status  07/15/2020 140 (H) mg/dL (calc) Final    Comment:    Reference range: <100 . Desirable range <100 mg/dL for primary prevention;   <70 mg/dL for patients with CHD or diabetic patients  with > or = 2 CHD risk factors. Marland Kitchen LDL-C is now calculated using the Martin-Hopkins  calculation, which is a validated novel method providing  better accuracy than the Friedewald equation in the  estimation of LDL-C.  Horald Pollen et al. Lenox Ahr. 2202;542(70): 2061-2068  (http://education.[facility].com/faq/FAQ164)    Direct LDL  Date Value Ref Range Status  05/11/2020 154.0 mg/dL Final    Comment:    Optimal:  <100 mg/dLNear or  Above Optimal:  100-129 mg/dLBorderline High:  130-159 mg/dLHigh:  160-189 mg/dLVery High:  >190 mg/dL   HDL  Date Value Ref Range Status  07/15/2020 36 (L) > OR = 40 mg/dL Final   Triglycerides  Date Value Ref Range Status  07/15/2020 257 (H) <150 mg/dL Final    Comment:    . If a non-fasting specimen was collected, consider repeat triglyceride testing on a fasting specimen if clinically indicated.  Perry Mount et al. J. of Clin. Lipidol. 2015;9:129-169. Marland Kitchen          Passed - Last BP in normal range    BP Readings from Last 1 Encounters:  07/15/20 124/81

## 2021-09-16 ENCOUNTER — Encounter: Payer: BC Managed Care – PPO | Admitting: Internal Medicine

## 2021-09-26 ENCOUNTER — Encounter: Payer: BC Managed Care – PPO | Admitting: Internal Medicine

## 2021-09-26 NOTE — Progress Notes (Deleted)
Subjective:    Patient ID: Joel Andersen, male    DOB: May 04, 1990, 31 y.o.   MRN: [id number]  HPI  Patient presents to clinic today for his annual exam.  Flu: Tetanus: 09/2017 COVID: Pfizer x2 Dentist:  Diet: Exercise:  Review of Systems     Past Medical History:  Diagnosis Date   Anxiety     Current Outpatient Medications  Medication Sig Dispense Refill   albuterol (VENTOLIN HFA) 108 (90 Base) MCG/ACT inhaler Inhale 2 puffs into the lungs every 6 (six) hours as needed for wheezing or shortness of breath. 18 g 0   amoxicillin-clavulanate (AUGMENTIN) 875-125 MG tablet Take 1 tablet by mouth 2 (two) times daily. 20 tablet 0   hydrOXYzine (ATARAX/VISTARIL) 10 MG tablet TAKE 1 TABLET BY MOUTH EVERY DAY AS NEEDED 90 tablet 1   predniSONE (DELTASONE) 10 MG tablet Take 6 tabs on day 1, 5 tabs on day 2, 4 tabs on day 3, 3 tabs on day 4, 2 tabs on day 5, 1 tab on day 6 21 tablet 0   triamcinolone cream (KENALOG) 0.5 % Apply 1 application topically 2 (two) times daily. (Patient not taking: Reported on 07/15/2020) 30 g 1   venlafaxine XR (EFFEXOR-XR) 150 MG 24 hr capsule TAKE 1 CAPSULE BY MOUTH DAILY WITH BREAKFAST. 90 capsule 0   No current facility-administered medications for this visit.    Allergies  Allergen Reactions   Hydrocodone-Acetaminophen Rash    Family History  Problem Relation Age of Onset   Diabetes Mother    Hypertension Mother    Diabetes Maternal Grandmother    Hypertension Maternal Grandmother     Social History   Socioeconomic History   Marital status: Married    Spouse name: Not on file   Number of children: Not on file   Years of education: Not on file   Highest education level: Not on file  Occupational History   Not on file  Tobacco Use   Smoking status: Never   Smokeless tobacco: Current    Types: Chew  Vaping Use   Vaping Use: Never used  Substance and Sexual Activity   Alcohol use: Yes    Comment: rare   Drug use: Never   Sexual  activity: Not on file  Other Topics Concern   Not on file  Social History Narrative   Not on file   Social Determinants of Health   Financial Resource Strain: Not on file  Food Insecurity: Not on file  Transportation Needs: Not on file  Physical Activity: Not on file  Stress: Not on file  Social Connections: Not on file  Intimate Partner Violence: Not on file     Constitutional: Denies fever, malaise, fatigue, headache or abrupt weight changes.  HEENT: Denies eye pain, eye redness, ear pain, ringing in the ears, wax buildup, runny nose, nasal congestion, bloody nose, or sore throat. Respiratory: Denies difficulty breathing, shortness of breath, cough or sputum production.   Cardiovascular: Denies chest pain, chest tightness, palpitations or swelling in the hands or feet.  Gastrointestinal: Patient reports intermittent reflux.  Denies abdominal pain, bloating, constipation, diarrhea or blood in the stool.  GU: Denies urgency, frequency, pain with urination, burning sensation, blood in urine, odor or discharge. Musculoskeletal: Denies decrease in range of motion, difficulty with gait, muscle pain or joint pain and swelling.  Skin: Denies redness, rashes, lesions or ulcercations.  Neurological: Denies dizziness, difficulty with memory, difficulty with speech or problems with balance and coordination.  Psych: Patient has a history of anxiety.  Denies depression, SI/HI.  No other specific complaints in a complete review of systems (except as listed in HPI above).  Objective:   Physical Exam   There were no vitals taken for this visit. Wt Readings from Last 3 Encounters:  07/15/20 199 lb 3.2 oz (90.4 kg)  05/11/20 196 lb (88.9 kg)  04/26/20 198 lb (89.8 kg)    General: Appears their stated age, well developed, well nourished in NAD. Skin: Warm, dry and intact. No rashes, lesions or ulcerations noted. HEENT: Head: normal shape and size; Eyes: sclera white, no icterus, conjunctiva  pink, PERRLA and EOMs intact; Ears: Tm's gray and intact, normal light reflex; Nose: mucosa pink and moist, septum midline; Throat/Mouth: Teeth present, mucosa pink and moist, no exudate, lesions or ulcerations noted.  Neck:  Neck supple, trachea midline. No masses, lumps or thyromegaly present.  Cardiovascular: Normal rate and rhythm. S1,S2 noted.  No murmur, rubs or gallops noted. No JVD or BLE edema. No carotid bruits noted. Pulmonary/Chest: Normal effort and positive vesicular breath sounds. No respiratory distress. No wheezes, rales or ronchi noted.  Abdomen: Soft and nontender. Normal bowel sounds. No distention or masses noted. Liver, spleen and kidneys non palpable. Musculoskeletal: Normal range of motion. No signs of joint swelling. No difficulty with gait.  Neurological: Alert and oriented. Cranial nerves II-XII grossly intact. Coordination normal.  Psychiatric: Mood and affect normal. Behavior is normal. Judgment and thought content normal.     BMET    Component Value Date/Time   NA 139 05/11/2020 1445   K 4.5 05/11/2020 1445   CL 103 05/11/2020 1445   CO2 28 05/11/2020 1445   GLUCOSE 86 05/11/2020 1445   BUN 7 05/11/2020 1445   CREATININE 0.89 05/11/2020 1445   CREATININE 0.90 02/21/2019 1633   CALCIUM 9.7 05/11/2020 1445   GFRNONAA NOT CALCULATED 06/26/2007 1457   GFRAA  06/26/2007 1457    NOT CALCULATED        The eGFR has been calculated using the MDRD equation. This calculation has not been validated in all clinical    Lipid Panel     Component Value Date/Time   CHOL 216 (H) 07/15/2020 1036   TRIG 257 (H) 07/15/2020 1036   HDL 36 (L) 07/15/2020 1036   CHOLHDL 6.0 (H) 07/15/2020 1036   VLDL 43.8 (H) 05/11/2020 1445   LDLCALC 140 (H) 07/15/2020 1036    CBC    Component Value Date/Time   WBC 7.9 05/11/2020 1445   RBC 5.06 05/11/2020 1445   HGB 15.8 05/11/2020 1445   HCT 46.6 05/11/2020 1445   PLT 315.0 05/11/2020 1445   MCV 92.0 05/11/2020 1445   MCH  32.4 02/21/2019 1633   MCHC 34.0 05/11/2020 1445   RDW 13.8 05/11/2020 1445   LYMPHSABS 1.7 06/26/2007 1457   MONOABS 0.7 06/26/2007 1457   EOSABS 0.0 06/26/2007 1457   BASOSABS 0.1 06/26/2007 1457    Hgb A1C Lab Results  Component Value Date   HGBA1C 5.1 05/11/2020           Assessment & Plan:   Preventive Health Maintenance:  Encouraged him to get a flu shot in the fall Tetanus UTD Encouraged him to get a COVID booster Advised him to see a dentist annually We will check CBC, c-Met, lipid, A1c today  RTC in 6 months, follow-up chronic conditions Webb Silversmith, NP

## 2021-11-04 ENCOUNTER — Encounter: Payer: Self-pay | Admitting: Internal Medicine

## 2021-11-04 ENCOUNTER — Ambulatory Visit (INDEPENDENT_AMBULATORY_CARE_PROVIDER_SITE_OTHER): Payer: BC Managed Care – PPO | Admitting: Internal Medicine

## 2021-11-04 VITALS — BP 142/96 | HR 88 | Temp 97.5°F | Ht 70.0 in | Wt 196.0 lb

## 2021-11-04 DIAGNOSIS — Z6828 Body mass index (BMI) 28.0-28.9, adult: Secondary | ICD-10-CM | POA: Insufficient documentation

## 2021-11-04 DIAGNOSIS — Z0001 Encounter for general adult medical examination with abnormal findings: Secondary | ICD-10-CM

## 2021-11-04 DIAGNOSIS — R7309 Other abnormal glucose: Secondary | ICD-10-CM

## 2021-11-04 DIAGNOSIS — E663 Overweight: Secondary | ICD-10-CM | POA: Diagnosis not present

## 2021-11-04 DIAGNOSIS — R030 Elevated blood-pressure reading, without diagnosis of hypertension: Secondary | ICD-10-CM

## 2021-11-04 DIAGNOSIS — E782 Mixed hyperlipidemia: Secondary | ICD-10-CM

## 2021-11-04 DIAGNOSIS — F411 Generalized anxiety disorder: Secondary | ICD-10-CM | POA: Diagnosis not present

## 2021-11-04 MED ORDER — HYDROXYZINE PAMOATE 50 MG PO CAPS: 50.0000 mg | ORAL_CAPSULE | Freq: Every day | ORAL | 1 refills | Status: DC | PRN

## 2021-11-04 MED ORDER — BUSPIRONE HCL 10 MG PO TABS: 10.0000 mg | ORAL_TABLET | Freq: Two times a day (BID) | ORAL | 1 refills | Status: DC | PRN

## 2021-11-04 MED ORDER — VENLAFAXINE HCL ER 150 MG PO CP24: 150.0000 mg | ORAL_CAPSULE | Freq: Every day | ORAL | 1 refills | Status: DC

## 2021-11-04 NOTE — Assessment & Plan Note (Signed)
Deteriorated Continue venlafaxine 150 mg daily We will add buspirone 10 mg twice daily as needed We will increase hydroxyzine to 50 mg daily as needed Support offered

## 2021-11-04 NOTE — Assessment & Plan Note (Signed)
Encourage diet and exercise for weight loss 

## 2021-11-04 NOTE — Progress Notes (Signed)
Subjective:    Patient ID: Joel Andersen, male    DOB: 27-Jun-1990, 31 y.o.   MRN: [id number]  HPI  Patient presents to clinic today for his annual exam.  Flu: Never Tetanus: 09/2017 COVID: [employer] x2 Dentist: as needed  Diet: He does eat meat. He consumes fruits and veggies. He tries to avoid fried foods. He drinks mostly water, soda and gatorade. Exercise: Walking  Review of Systems     Past Medical History:  Diagnosis Date   Anxiety     Current Outpatient Medications  Medication Sig Dispense Refill   albuterol (VENTOLIN HFA) 108 (90 Base) MCG/ACT inhaler Inhale 2 puffs into the lungs every 6 (six) hours as needed for wheezing or shortness of breath. 18 g 0   amoxicillin-clavulanate (AUGMENTIN) 875-125 MG tablet Take 1 tablet by mouth 2 (two) times daily. 20 tablet 0   hydrOXYzine (ATARAX/VISTARIL) 10 MG tablet TAKE 1 TABLET BY MOUTH EVERY DAY AS NEEDED 90 tablet 1   predniSONE (DELTASONE) 10 MG tablet Take 6 tabs on day 1, 5 tabs on day 2, 4 tabs on day 3, 3 tabs on day 4, 2 tabs on day 5, 1 tab on day 6 21 tablet 0   triamcinolone cream (KENALOG) 0.5 % Apply 1 application topically 2 (two) times daily. (Patient not taking: Reported on 07/15/2020) 30 g 1   venlafaxine XR (EFFEXOR-XR) 150 MG 24 hr capsule TAKE 1 CAPSULE BY MOUTH DAILY WITH BREAKFAST. 90 capsule 0   No current facility-administered medications for this visit.    Allergies  Allergen Reactions   Hydrocodone-Acetaminophen Rash    Family History  Problem Relation Age of Onset   Diabetes Mother    Hypertension Mother    Diabetes Maternal Grandmother    Hypertension Maternal Grandmother     Social History   Socioeconomic History   Marital status: Married    Spouse name: Not on file   Number of children: Not on file   Years of education: Not on file   Highest education level: Not on file  Occupational History   Not on file  Tobacco Use   Smoking status: Never   Smokeless tobacco: Current    Types:  Chew  Vaping Use   Vaping Use: Never used  Substance and Sexual Activity   Alcohol use: Yes    Comment: rare   Drug use: Never   Sexual activity: Not on file  Other Topics Concern   Not on file  Social History Narrative   Not on file   Social Determinants of Health   Financial Resource Strain: Not on file  Food Insecurity: Not on file  Transportation Needs: Not on file  Physical Activity: Not on file  Stress: Not on file  Social Connections: Not on file  Intimate Partner Violence: Not on file     Constitutional: Denies fever, malaise, fatigue, headache or abrupt weight changes.  HEENT: Denies eye pain, eye redness, ear pain, ringing in the ears, wax buildup, runny nose, nasal congestion, bloody nose, or sore throat. Respiratory: Denies difficulty breathing, shortness of breath, cough or sputum production.   Cardiovascular: Denies chest pain, chest tightness, palpitations or swelling in the hands or feet.  Gastrointestinal: Pt reports intermittent reflux. Denies abdominal pain, bloating, constipation, diarrhea or blood in the stool.  GU: Denies urgency, frequency, pain with urination, burning sensation, blood in urine, odor or discharge. Musculoskeletal: Denies decrease in range of motion, difficulty with gait, muscle pain or joint pain and swelling.  Skin: Denies redness, rashes, lesions or ulcercations.  Neurological: Denies dizziness, difficulty with memory, difficulty with speech or problems with balance and coordination.  Psych: Patient has a history of anxiety.  Denies depression, SI/HI.  No other specific complaints in a complete review of systems (except as listed in HPI above).  Objective:   Physical Exam   BP (!) 142/96 (BP Location: Left Arm, Patient Position: Sitting, Cuff Size: Normal)   Pulse 88   Temp (!) 97.5 F (36.4 C) (Temporal)   Ht $R'5\' 10"'EK$  (1.778 m)   Wt 196 lb (88.9 kg)   SpO2 99%   BMI 28.12 kg/m   Wt Readings from Last 3 Encounters:  07/15/20  199 lb 3.2 oz (90.4 kg)  05/11/20 196 lb (88.9 kg)  04/26/20 198 lb (89.8 kg)    General: Appears his stated age, overweight, in NAD. Skin: Warm, dry and intact. HEENT: Head: normal shape and size; Eyes: sclera white, no icterus, conjunctiva pink, PERRLA and EOMs intact;  Neck:  Neck supple, trachea midline. No masses, lumps or thyromegaly present.  Cardiovascular: Normal rate and rhythm. S1,S2 noted.  No murmur, rubs or gallops noted. No JVD or BLE edema.  Pulmonary/Chest: Normal effort and positive vesicular breath sounds. No respiratory distress. No wheezes, rales or ronchi noted.  Abdomen:  Normal bowel sounds. Musculoskeletal: Straight 5/5 BUE/BLE no difficulty with gait.  Neurological: Alert and oriented. Cranial nerves II-XII grossly intact. Coordination normal.  Psychiatric: Mood and affect normal.  Anxious appearing. Judgment and thought content normal.    BMET    Component Value Date/Time   NA 139 05/11/2020 1445   K 4.5 05/11/2020 1445   CL 103 05/11/2020 1445   CO2 28 05/11/2020 1445   GLUCOSE 86 05/11/2020 1445   BUN 7 05/11/2020 1445   CREATININE 0.89 05/11/2020 1445   CREATININE 0.90 02/21/2019 1633   CALCIUM 9.7 05/11/2020 1445   GFRNONAA NOT CALCULATED 06/26/2007 1457   GFRAA  06/26/2007 1457    NOT CALCULATED        The eGFR has been calculated using the MDRD equation. This calculation has not been validated in all clinical    Lipid Panel     Component Value Date/Time   CHOL 216 (H) 07/15/2020 1036   TRIG 257 (H) 07/15/2020 1036   HDL 36 (L) 07/15/2020 1036   CHOLHDL 6.0 (H) 07/15/2020 1036   VLDL 43.8 (H) 05/11/2020 1445   LDLCALC 140 (H) 07/15/2020 1036    CBC    Component Value Date/Time   WBC 7.9 05/11/2020 1445   RBC 5.06 05/11/2020 1445   HGB 15.8 05/11/2020 1445   HCT 46.6 05/11/2020 1445   PLT 315.0 05/11/2020 1445   MCV 92.0 05/11/2020 1445   MCH 32.4 02/21/2019 1633   MCHC 34.0 05/11/2020 1445   RDW 13.8 05/11/2020 1445    LYMPHSABS 1.7 06/26/2007 1457   MONOABS 0.7 06/26/2007 1457   EOSABS 0.0 06/26/2007 1457   BASOSABS 0.1 06/26/2007 1457    Hgb A1C Lab Results  Component Value Date   HGBA1C 5.1 05/11/2020           Assessment & Plan:  Preventative health maintenance:  He declines flu shot today Tetanus UTD Encouraged him to get his COVID booster Advised him to see a dentist annually We will ask CBC, c-Met, lipid, A1c today  Elevated Blood Pressure Reading in Office without Diagnosis of HTN:  They will monitor at home, if BP consistently >1 30/80 they will let  me know Reinforced DASH diet and exercise for weight loss  RTC in 6 months, follow-up chronic conditions Webb Silversmith, NP

## 2021-11-04 NOTE — Patient Instructions (Signed)
Health Maintenance, Male Adopting a healthy lifestyle and getting preventive care are important in promoting health and wellness. Ask your health care provider about: The right schedule for you to have regular tests and exams. Things you can do on your own to prevent diseases and keep yourself healthy. What should I know about diet, weight, and exercise? Eat a healthy diet  Eat a diet that includes plenty of vegetables, fruits, low-fat dairy products, and lean protein. Do not eat a lot of foods that are high in solid fats, added sugars, or sodium. Maintain a healthy weight Body mass index (BMI) is a measurement that can be used to identify possible weight problems. It estimates body fat based on height and weight. Your health care provider can help determine your BMI and help you achieve or maintain a healthy weight. Get regular exercise Get regular exercise. This is one of the most important things you can do for your health. Most adults should: Exercise for at least 150 minutes each week. The exercise should increase your heart rate and make you sweat (moderate-intensity exercise). Do strengthening exercises at least twice a week. This is in addition to the moderate-intensity exercise. Spend less time sitting. Even light physical activity can be beneficial. Watch cholesterol and blood lipids Have your blood tested for lipids and cholesterol at 32 years of age, then have this test every 5 years. You may need to have your cholesterol levels checked more often if: Your lipid or cholesterol levels are high. You are older than 32 years of age. You are at high risk for heart disease. What should I know about cancer screening? Many types of cancers can be detected early and may often be prevented. Depending on your health history and family history, you may need to have cancer screening at various ages. This may include screening for: Colorectal cancer. Prostate cancer. Skin cancer. Lung  cancer. What should I know about heart disease, diabetes, and high blood pressure? Blood pressure and heart disease High blood pressure causes heart disease and increases the risk of stroke. This is more likely to develop in people who have high blood pressure readings or are overweight. Talk with your health care provider about your target blood pressure readings. Have your blood pressure checked: Every 3-5 years if you are 18-39 years of age. Every year if you are 40 years old or older. If you are between the ages of 65 and 75 and are a current or former smoker, ask your health care provider if you should have a one-time screening for abdominal aortic aneurysm (AAA). Diabetes Have regular diabetes screenings. This checks your fasting blood sugar level. Have the screening done: Once every three years after age 45 if you are at a normal weight and have a low risk for diabetes. More often and at a younger age if you are overweight or have a high risk for diabetes. What should I know about preventing infection? Hepatitis B If you have a higher risk for hepatitis B, you should be screened for this virus. Talk with your health care provider to find out if you are at risk for hepatitis B infection. Hepatitis C Blood testing is recommended for: Everyone born from 1945 through 1965. Anyone with known risk factors for hepatitis C. Sexually transmitted infections (STIs) You should be screened each year for STIs, including gonorrhea and chlamydia, if: You are sexually active and are younger than 32 years of age. You are older than 32 years of age and your   health care provider tells you that you are at risk for this type of infection. Your sexual activity has changed since you were last screened, and you are at increased risk for chlamydia or gonorrhea. Ask your health care provider if you are at risk. Ask your health care provider about whether you are at high risk for HIV. Your health care provider  may recommend a prescription medicine to help prevent HIV infection. If you choose to take medicine to prevent HIV, you should first get tested for HIV. You should then be tested every 3 months for as long as you are taking the medicine. Follow these instructions at home: Alcohol use Do not drink alcohol if your health care provider tells you not to drink. If you drink alcohol: Limit how much you have to 0-2 drinks a day. Know how much alcohol is in your drink. In the [place] one drink equals one 12 oz bottle of beer (355 mL), one 5 oz glass of wine (148 mL), or one 1 oz glass of hard liquor (44 mL). Lifestyle Do not use any products that contain nicotine or tobacco. These products include cigarettes, chewing tobacco, and vaping devices, such as e-cigarettes. If you need help quitting, ask your health care provider. Do not use street drugs. Do not share needles. Ask your health care provider for help if you need support or information about quitting drugs. General instructions Schedule regular health, dental, and eye exams. Stay current with your vaccines. Tell your health care provider if: You often feel depressed. You have ever been abused or do not feel safe at home. Summary Adopting a healthy lifestyle and getting preventive care are important in promoting health and wellness. Follow your health care provider's instructions about healthy diet, exercising, and getting tested or screened for diseases. Follow your health care provider's instructions on monitoring your cholesterol and blood pressure. This information is not intended to replace advice given to you by your health care provider. Make sure you discuss any questions you have with your health care provider. Document Revised: 06/14/2020 Document Reviewed: 06/14/2020 Elsevier Patient Education  2023 [employer] Inc[employer]  

## 2021-11-05 LAB — COMPLETE METABOLIC PANEL WITH GFR
AG Ratio: 1.8 (calc) (ref 1.0–2.5)
ALT: 24 U/L (ref 9–46)
AST: 17 U/L (ref 10–40)
Albumin: 4.7 g/dL (ref 3.6–5.1)
Alkaline phosphatase (APISO): 81 U/L (ref 36–130)
BUN: 8 mg/dL (ref 7–25)
CO2: 27 mmol/L (ref 20–32)
Calcium: 9.5 mg/dL (ref 8.6–10.3)
Chloride: 105 mmol/L (ref 98–110)
Creat: 0.83 mg/dL (ref 0.60–1.26)
Globulin: 2.6 g/dL (calc) (ref 1.9–3.7)
Glucose, Bld: 77 mg/dL (ref 65–99)
Potassium: 4 mmol/L (ref 3.5–5.3)
Sodium: 139 mmol/L (ref 135–146)
Total Bilirubin: 0.5 mg/dL (ref 0.2–1.2)
Total Protein: 7.3 g/dL (ref 6.1–8.1)
eGFR: 119 mL/min/{1.73_m2} (ref >=60)

## 2021-11-05 LAB — CBC
HCT: 43.5 % (ref 38.5–50.0)
Hemoglobin: 15.3 g/dL (ref 13.2–17.1)
MCH: 31.7 pg (ref 27.0–33.0)
MCHC: 35.2 g/dL (ref 32.0–36.0)
MCV: 90.1 fL (ref 80.0–100.0)
MPV: 10 fL (ref 7.5–12.5)
Platelets: 307 10*3/uL (ref 140–400)
RBC: 4.83 10*6/uL (ref 4.20–5.80)
RDW: 13 % (ref 11.0–15.0)
WBC: 8.4 10*3/uL (ref 3.8–10.8)

## 2021-11-05 LAB — LIPID PANEL
Cholesterol: 202 mg/dL — ABNORMAL HIGH (ref <200)
HDL: 32 mg/dL — ABNORMAL LOW (ref >=40)
LDL Cholesterol (Calc): 143 mg/dL (calc) — ABNORMAL HIGH
Non-HDL Cholesterol (Calc): 170 mg/dL (calc) — ABNORMAL HIGH (ref <130)
Total CHOL/HDL Ratio: 6.3 (calc) — ABNORMAL HIGH (ref <5.0)
Triglycerides: 142 mg/dL (ref <150)

## 2021-11-05 LAB — HEMOGLOBIN A1C
Hgb A1c MFr Bld: 5 % of total Hgb (ref <5.7)
Mean Plasma Glucose: 97 mg/dL
eAG (mmol/L): 5.4 mmol/L

## 2021-11-08 ENCOUNTER — Telehealth: Payer: Self-pay

## 2021-11-08 NOTE — Telephone Encounter (Signed)
-----   Message from Jearld Fenton, NP sent at 11/06/2021  8:21 AM EDT ----- Cholesterol remains elevated. I would recommend starting cholesterol lowering medication. Let me know if he is agreeable and I will send this in. Blood counts are normal. Liver and kidney function are normal. He does not have diabetes.

## 2021-11-08 NOTE — Telephone Encounter (Signed)
LMTCB 11/08/2021.  PEC Please advise pt when he calls back.   Thanks,   -Mickel Baas

## 2021-11-15 MED ORDER — ATORVASTATIN CALCIUM 10 MG PO TABS: 10.0000 mg | ORAL_TABLET | Freq: Every day | ORAL | 0 refills | Status: DC

## 2021-11-15 NOTE — Addendum Note (Signed)
Addended by: Jearld Fenton on: 11/15/2021 12:05 PM   Modules accepted: Orders

## 2021-12-20 ENCOUNTER — Telehealth: Payer: Self-pay

## 2021-12-20 NOTE — Telephone Encounter (Signed)
Does he need an appointment?  Thanks,   -Vernona Rieger

## 2021-12-20 NOTE — Telephone Encounter (Signed)
He has to have a visit

## 2021-12-20 NOTE — Telephone Encounter (Signed)
Copied from CRM [id number]. Topic: Appointment Scheduling - Scheduling Inquiry for Clinic >> Dec 20, 2021 11:32 AM Haroldine Laws wrote: Reason for CRM: Pt's wife called saying her husband tested positive for covid this morning and his work needs a note saying he has covid.  Does he need a visit or can he just come by and get a note for work.  CB [phone]

## 2021-12-20 NOTE — Telephone Encounter (Signed)
Apt schedule for 12/21/2021   Thanks,   -Vernona Rieger

## 2021-12-21 ENCOUNTER — Ambulatory Visit: Payer: BC Managed Care – PPO | Admitting: Internal Medicine

## 2021-12-21 ENCOUNTER — Encounter: Payer: Self-pay | Admitting: Internal Medicine

## 2021-12-21 VITALS — BP 134/86 | HR 82 | Temp 96.9°F | Wt 194.0 lb

## 2021-12-21 DIAGNOSIS — U071 COVID-19: Secondary | ICD-10-CM | POA: Diagnosis not present

## 2021-12-21 NOTE — Progress Notes (Signed)
Subjective:    Patient ID: Joel Andersen, male    DOB: July 29, 1990, 31 y.o.   MRN: [id number]  HPI  Patient presents to the clinic today with complaint of headache, runny nose, nasal congestion, sore throat, cough, shortness of breath. This started 2 day ago.  He is blowing clear mucus out of his nose.  The cough is mostly nonproductive.  He denies ear pain, chest pain, nausea, vomiting, diarrhea. He reports fever and chills but denies body aches. He has tried [employer] and Flu with minimal relief of symptoms.  He tested positive for COVID yesterday.  Review of Systems     Past Medical History:  Diagnosis Date   Anxiety     Current Outpatient Medications  Medication Sig Dispense Refill   atorvastatin (LIPITOR) 10 MG tablet Take 1 tablet (10 mg total) by mouth daily. 90 tablet 0   busPIRone (BUSPAR) 10 MG tablet Take 1 tablet (10 mg total) by mouth 2 (two) times daily as needed. 180 tablet 1   hydrOXYzine (VISTARIL) 50 MG capsule Take 1 capsule (50 mg total) by mouth daily as needed. 90 capsule 1   venlafaxine XR (EFFEXOR-XR) 150 MG 24 hr capsule Take 1 capsule (150 mg total) by mouth daily with breakfast. 90 capsule 1   No current facility-administered medications for this visit.    Allergies  Allergen Reactions   Hydrocodone-Acetaminophen Rash    Family History  Problem Relation Age of Onset   Diabetes Mother    Hypertension Mother    Diabetes Maternal Grandmother    Hypertension Maternal Grandmother     Social History   Socioeconomic History   Marital status: Married    Spouse name: Not on file   Number of children: Not on file   Years of education: Not on file   Highest education level: Not on file  Occupational History   Not on file  Tobacco Use   Smoking status: Never   Smokeless tobacco: Current    Types: Chew  Vaping Use   Vaping Use: Never used  Substance and Sexual Activity   Alcohol use: Yes    Comment: rare   Drug use: Never   Sexual  activity: Not on file  Other Topics Concern   Not on file  Social History Narrative   Not on file   Social Determinants of Health   Financial Resource Strain: Not on file  Food Insecurity: Not on file  Transportation Needs: Not on file  Physical Activity: Not on file  Stress: Not on file  Social Connections: Not on file  Intimate Partner Violence: Not on file     Constitutional: Patient reports fever and headache.  Denies or abrupt weight changes.  HEENT: Patient reports runny nose, nasal congestion and sore throat.  Denies eye pain, eye redness, ear pain, ringing in the ears, wax buildup, bloody nose. Respiratory: Patient reports cough and shortness of breath.  Denies difficulty breathing, shortness of breath.   Cardiovascular: Denies chest pain, chest tightness, palpitations or swelling in the hands or feet.  Gastrointestinal: Denies abdominal pain, bloating, constipation, diarrhea or blood in the stool.   No other specific complaints in a complete review of systems (except as listed in HPI above).  Objective:   Physical Exam  BP 134/86 (BP Location: Right Arm, Patient Position: Sitting, Cuff Size: Normal)   Pulse 82   Temp (!) 96.9 F (36.1 C) (Temporal)   Wt 194 lb (88 kg)   SpO2 100%  BMI 27.84 kg/m   Wt Readings from Last 3 Encounters:  11/04/21 196 lb (88.9 kg)  07/15/20 199 lb 3.2 oz (90.4 kg)  05/11/20 196 lb (88.9 kg)    General: Appears his stated age, appears unwell but in NAD. Skin: Warm, dry and intact. No rashes noted. HEENT: Head: normal shape and size; Eyes: sclera white, no icterus, conjunctiva pink, PERRLA and EOMs intact;  Neck: No adenopathy noted. Cardiovascular: Normal rate and rhythm. S1,S2 noted.  No murmur, rubs or gallops noted.  Pulmonary/Chest: Normal effort and coarse vesicular breath sounds. No respiratory distress. No wheezes, rales or ronchi noted.  Neurological: Alert and oriented.   BMET    Component Value Date/Time   NA 139  11/04/2021 1429   K 4.0 11/04/2021 1429   CL 105 11/04/2021 1429   CO2 27 11/04/2021 1429   GLUCOSE 77 11/04/2021 1429   BUN 8 11/04/2021 1429   CREATININE 0.83 11/04/2021 1429   CALCIUM 9.5 11/04/2021 1429   GFRNONAA NOT CALCULATED 06/26/2007 1457   GFRAA  06/26/2007 1457    NOT CALCULATED        The eGFR has been calculated using the MDRD equation. This calculation has not been validated in all clinical    Lipid Panel     Component Value Date/Time   CHOL 202 (H) 11/04/2021 1429   TRIG 142 11/04/2021 1429   HDL 32 (L) 11/04/2021 1429   CHOLHDL 6.3 (H) 11/04/2021 1429   VLDL 43.8 (H) 05/11/2020 1445   LDLCALC 143 (H) 11/04/2021 1429    CBC    Component Value Date/Time   WBC 8.4 11/04/2021 1429   RBC 4.83 11/04/2021 1429   HGB 15.3 11/04/2021 1429   HCT 43.5 11/04/2021 1429   PLT 307 11/04/2021 1429   MCV 90.1 11/04/2021 1429   MCH 31.7 11/04/2021 1429   MCHC 35.2 11/04/2021 1429   RDW 13.0 11/04/2021 1429   LYMPHSABS 1.7 06/26/2007 1457   MONOABS 0.7 06/26/2007 1457   EOSABS 0.0 06/26/2007 1457   BASOSABS 0.1 06/26/2007 1457    Hgb A1C Lab Results  Component Value Date   HGBA1C 5.0 11/04/2021            Assessment & Plan:   COVID-19:  Encourage rest and fluids Discussed quarantine precautions He declines Rx for Paxlovid twice daily x5 days Recommend ibuprofen and Tylenol OTC as needed for fever Okay to continue Alka-Seltzer cold and flu Work note provided  RTC in 4 months for follow-up of chronic conditions Webb Silversmith, NP

## 2021-12-21 NOTE — Patient Instructions (Signed)

## 2022-02-08 ENCOUNTER — Encounter: Payer: Self-pay | Admitting: Internal Medicine

## 2022-02-08 ENCOUNTER — Ambulatory Visit: Payer: 59 | Admitting: Internal Medicine

## 2022-02-08 VITALS — BP 132/84 | HR 97 | Temp 97.1°F | Wt 198.0 lb

## 2022-02-08 DIAGNOSIS — F411 Generalized anxiety disorder: Secondary | ICD-10-CM | POA: Diagnosis not present

## 2022-02-08 MED ORDER — VENLAFAXINE HCL ER 75 MG PO CP24: 75.0000 mg | ORAL_CAPSULE | Freq: Every day | ORAL | 0 refills | Status: DC

## 2022-02-08 MED ORDER — BUSPIRONE HCL 15 MG PO TABS: 15.0000 mg | ORAL_TABLET | Freq: Two times a day (BID) | ORAL | 0 refills | Status: DC

## 2022-02-08 NOTE — Progress Notes (Signed)
Subjective:    Patient ID: Joel Andersen, male    DOB: 1990-07-26, 32 y.o.   MRN: [id number]  HPI  Patient presents to clinic today for follow-up of anxiety.  He reports persistent anxiety that tends to worsen on a situational basis for example if he goes to the dentist or doctor's appointment.  He reports difficulty sleeping at night due to anxiety.  This is currently managed on Venlafaxine, Hydroxyzine and Buspirone.  He is not currently seeing a therapist.  He is also concerned that he may have ADD.  He has never been evaluated for this.  He denies depression, SI/HI.  Review of Systems     Past Medical History:  Diagnosis Date   Anxiety     Current Outpatient Medications  Medication Sig Dispense Refill   atorvastatin (LIPITOR) 10 MG tablet Take 1 tablet (10 mg total) by mouth daily. 90 tablet 0   busPIRone (BUSPAR) 10 MG tablet Take 1 tablet (10 mg total) by mouth 2 (two) times daily as needed. 180 tablet 1   hydrOXYzine (VISTARIL) 50 MG capsule Take 1 capsule (50 mg total) by mouth daily as needed. 90 capsule 1   venlafaxine XR (EFFEXOR-XR) 150 MG 24 hr capsule Take 1 capsule (150 mg total) by mouth daily with breakfast. 90 capsule 1   No current facility-administered medications for this visit.    Allergies  Allergen Reactions   Hydrocodone-Acetaminophen Rash    Family History  Problem Relation Age of Onset   Diabetes Mother    Hypertension Mother    Diabetes Maternal Grandmother    Hypertension Maternal Grandmother     Social History   Socioeconomic History   Marital status: Married    Spouse name: Not on file   Number of children: Not on file   Years of education: Not on file   Highest education level: Not on file  Occupational History   Not on file  Tobacco Use   Smoking status: Never   Smokeless tobacco: Current    Types: Chew  Vaping Use   Vaping Use: Never used  Substance and Sexual Activity   Alcohol use: Yes    Comment: rare   Drug use: Never    Sexual activity: Not on file  Other Topics Concern   Not on file  Social History Narrative   Not on file   Social Determinants of Health   Financial Resource Strain: Not on file  Food Insecurity: Not on file  Transportation Needs: Not on file  Physical Activity: Not on file  Stress: Not on file  Social Connections: Not on file  Intimate Partner Violence: Not on file     Constitutional: Denies fever, malaise, fatigue, headache or abrupt weight changes.  HEENT: Denies eye pain, eye redness, ear pain, ringing in the ears, wax buildup, runny nose, nasal congestion, bloody nose, or sore throat. Respiratory: Denies difficulty breathing, shortness of breath, cough or sputum production.   Cardiovascular: Denies chest pain, chest tightness, palpitations or swelling in the hands or feet.  Gastrointestinal: Denies abdominal pain, bloating, constipation, diarrhea or blood in the stool.  GU: Denies urgency, frequency, pain with urination, burning sensation, blood in urine, odor or discharge. Musculoskeletal: Denies decrease in range of motion, difficulty with gait, muscle pain or joint pain and swelling.  Skin: Denies redness, rashes, lesions or ulcercations.  Neurological: Denies dizziness, difficulty with memory, difficulty with speech or problems with balance and coordination.  Psych: Patient has a history of anxiety.  Denies depression, SI/HI.  No other specific complaints in a complete review of systems (except as listed in HPI above).  Objective:   Physical Exam  BP 132/84 (BP Location: Right Arm, Patient Position: Sitting, Cuff Size: Normal)   Pulse 97   Temp (!) 97.1 F (36.2 C) (Temporal)   Wt 198 lb (89.8 kg)   SpO2 99%   BMI 28.41 kg/m   Wt Readings from Last 3 Encounters:  12/21/21 194 lb (88 kg)  11/04/21 196 lb (88.9 kg)  07/15/20 199 lb 3.2 oz (90.4 kg)    General: Appears his stated age, overweight, in NAD. Cardiovascular: Normal rate. Pulmonary/Chest: Normal  effort and positive vesicular breath sounds.  Neurological: Alert and oriented. Psychiatric: Mood and affect normal.  Mildly anxious appearing. Judgment and thought content normal.     BMET    Component Value Date/Time   NA 139 11/04/2021 1429   K 4.0 11/04/2021 1429   CL 105 11/04/2021 1429   CO2 27 11/04/2021 1429   GLUCOSE 77 11/04/2021 1429   BUN 8 11/04/2021 1429   CREATININE 0.83 11/04/2021 1429   CALCIUM 9.5 11/04/2021 1429   GFRNONAA NOT CALCULATED 06/26/2007 1457   GFRAA  06/26/2007 1457    NOT CALCULATED        The eGFR has been calculated using the MDRD equation. This calculation has not been validated in all clinical    Lipid Panel     Component Value Date/Time   CHOL 202 (H) 11/04/2021 1429   TRIG 142 11/04/2021 1429   HDL 32 (L) 11/04/2021 1429   CHOLHDL 6.3 (H) 11/04/2021 1429   VLDL 43.8 (H) 05/11/2020 1445   LDLCALC 143 (H) 11/04/2021 1429    CBC    Component Value Date/Time   WBC 8.4 11/04/2021 1429   RBC 4.83 11/04/2021 1429   HGB 15.3 11/04/2021 1429   HCT 43.5 11/04/2021 1429   PLT 307 11/04/2021 1429   MCV 90.1 11/04/2021 1429   MCH 31.7 11/04/2021 1429   MCHC 35.2 11/04/2021 1429   RDW 13.0 11/04/2021 1429   LYMPHSABS 1.7 06/26/2007 1457   MONOABS 0.7 06/26/2007 1457   EOSABS 0.0 06/26/2007 1457   BASOSABS 0.1 06/26/2007 1457    Hgb A1C Lab Results  Component Value Date   HGBA1C 5.0 11/04/2021           Assessment & Plan:     RTC in 2 months, follow-up chronic conditions Webb Silversmith, NP

## 2022-02-08 NOTE — Patient Instructions (Signed)
Managing Anxiety This video describes anxiety and what can be done to manage it. To view the content, go to this web address: [email or web address]  This video will expire on: 10/12/2023. If you need access to this video following this date, please reach out to the healthcare provider who assigned it to you. This information is not intended to replace advice given to you by your health care provider. Make sure you discuss any questions you have with your health care provider. Elsevier Patient Education  2023 Elsevier Inc.  

## 2022-02-08 NOTE — Assessment & Plan Note (Signed)
Deteriorated Increase venlafaxine to 225 mg daily Continue hydroxyzine as previously prescribed Increase buspirone to 50 mg twice daily As far as this concern for ADD, advised him to call [place] attention specialist to be formally evaluated for this.  If he obtains a positive diagnosis, this is something we can treat He declines referral for therapy for CBT counseling Support offered

## 2022-03-05 ENCOUNTER — Other Ambulatory Visit: Payer: Self-pay | Admitting: Internal Medicine

## 2022-03-06 NOTE — Telephone Encounter (Signed)
Requested Prescriptions  Pending Prescriptions Disp Refills   atorvastatin (LIPITOR) 10 MG tablet [Pharmacy Med Name: ATORVASTATIN 10 MG TABLET] 90 tablet 0    Sig: TAKE 1 TABLET BY MOUTH EVERY DAY     Cardiovascular:  Antilipid - Statins Failed - 03/05/2022  8:20 AM      Failed - Lipid Panel in normal range within the last 12 months    Cholesterol  Date Value Ref Range Status  11/04/2021 202 (H) <200 mg/dL Final   LDL Cholesterol (Calc)  Date Value Ref Range Status  11/04/2021 143 (H) mg/dL (calc) Final    Comment:    Reference range: <100 . Desirable range <100 mg/dL for primary prevention;   <70 mg/dL for patients with CHD or diabetic patients  with > or = 2 CHD risk factors. Marland Kitchen LDL-C is now calculated using the Martin-Hopkins  calculation, which is a validated novel method providing  better accuracy than the Friedewald equation in the  estimation of LDL-C.  Cresenciano Genre et al. Annamaria Helling. 1540;086(76): 2061-2068  (http://education.[facility].com/faq/FAQ164)    Direct LDL  Date Value Ref Range Status  05/11/2020 154.0 mg/dL Final    Comment:    Optimal:  <100 mg/dLNear or Above Optimal:  100-129 mg/dLBorderline High:  130-159 mg/dLHigh:  160-189 mg/dLVery High:  >190 mg/dL   HDL  Date Value Ref Range Status  11/04/2021 32 (L) > OR = 40 mg/dL Final   Triglycerides  Date Value Ref Range Status  11/04/2021 142 <150 mg/dL Final         Passed - Patient is not pregnant      Passed - Valid encounter within last 12 months    Recent Outpatient Visits           3 weeks ago GAD (generalized anxiety disorder)   [facility] [place], Coralie Keens, NP   2 months ago [facility] [place], Coralie Keens, NP   4 months ago Encounter for general adult medical examination with abnormal findings   [facility] [place], Coralie Keens, NP   1 year ago Mixed hyperlipidemia   [place], Coralie Keens, NP       Future Appointments             In 2 months [place], Coralie Keens, NP [facility]

## 2022-05-05 ENCOUNTER — Ambulatory Visit: Payer: Self-pay | Admitting: Internal Medicine

## 2022-05-09 ENCOUNTER — Other Ambulatory Visit: Payer: Self-pay | Admitting: Internal Medicine

## 2022-05-09 NOTE — Telephone Encounter (Signed)
Requested medication (s) are due for refill today: yes  Requested medication (s) are on the active medication list: yes  Last refill:  02/08/22 #90 0 refills  Future visit scheduled: yes in 1 week   Notes to clinic:  protocol failed. Last labs 11/04/21. Do you want to refill Rx?     Requested Prescriptions  Pending Prescriptions Disp Refills   venlafaxine XR (EFFEXOR-XR) 75 MG 24 hr capsule [Pharmacy Med Name: VENLAFAXINE HCL ER 75 MG CAP] 90 capsule 0    Sig: TAKE 1 CAPSULE BY MOUTH DAILY WITH BREAKFAST.     Psychiatry: Antidepressants - SNRI - desvenlafaxine & venlafaxine Failed - 05/09/2022  2:28 AM      Failed - Lipid Panel in normal range within the last 12 months    Cholesterol  Date Value Ref Range Status  11/04/2021 202 (H) <200 mg/dL Final   LDL Cholesterol (Calc)  Date Value Ref Range Status  11/04/2021 143 (H) mg/dL (calc) Final    Comment:    Reference range: <100 . Desirable range <100 mg/dL for primary prevention;   <70 mg/dL for patients with CHD or diabetic patients  with > or = 2 CHD risk factors. Marland Kitchen LDL-C is now calculated using the Martin-Hopkins  calculation, which is a validated novel method providing  better accuracy than the Friedewald equation in the  estimation of LDL-C.  Cresenciano Genre et al. Annamaria Helling. [id number]): 2061-2068  (http://education.[facility].com/faq/FAQ164)    Direct LDL  Date Value Ref Range Status  05/11/2020 154.0 mg/dL Final    Comment:    Optimal:  <100 mg/dLNear or Above Optimal:  100-129 mg/dLBorderline High:  130-159 mg/dLHigh:  160-189 mg/dLVery High:  >190 mg/dL   HDL  Date Value Ref Range Status  11/04/2021 32 (L) > OR = 40 mg/dL Final   Triglycerides  Date Value Ref Range Status  11/04/2021 142 <150 mg/dL Final         Passed - Cr in normal range and within 360 days    Creat  Date Value Ref Range Status  11/04/2021 0.83 0.60 - 1.26 mg/dL Final         Passed - Last BP in normal range    BP Readings from Last 1  Encounters:  02/08/22 132/84         Passed - Valid encounter within last 6 months    Recent Outpatient Visits           3 months ago GAD (generalized anxiety disorder)   [place], NP   4 months ago [facility] [place], Coralie Keens, NP   6 months ago Encounter for general adult medical examination with abnormal findings   [facility] [place], Coralie Keens, NP   1 year ago Mixed hyperlipidemia   [facility] [place], Coralie Keens, NP       Future Appointments             In 1 week Garnette Gunner, Coralie Keens, NP [facility]

## 2022-05-22 ENCOUNTER — Ambulatory Visit: Payer: Self-pay | Admitting: Internal Medicine

## 2022-06-02 ENCOUNTER — Other Ambulatory Visit: Payer: Self-pay | Admitting: Internal Medicine

## 2022-06-02 NOTE — Telephone Encounter (Signed)
Requested Prescriptions  Pending Prescriptions Disp Refills   busPIRone (BUSPAR) 15 MG tablet [Pharmacy Med Name: BUSPIRONE HCL 15 MG TABLET] 180 tablet 1    Sig: TAKE 1 TABLET BY MOUTH 2 TIMES DAILY.     Psychiatry: Anxiolytics/Hypnotics - Non-controlled Passed - 06/02/2022  9:30 AM      Passed - Valid encounter within last 12 months    Recent Outpatient Visits           3 months ago GAD (generalized anxiety disorder)   [facility], Salvadore Oxford, NP   5 months ago COVID-19   [facility] [place], Salvadore Oxford, NP   7 months ago Encounter for general adult medical examination with abnormal findings   [facility] [place], Salvadore Oxford, NP   1 year ago Mixed hyperlipidemia   [facility] [place], Salvadore Oxford, NP       Future Appointments             In 1 week Sam[place] Si, Salvadore Oxford, NP  [facility]

## 2022-06-12 ENCOUNTER — Ambulatory Visit: Payer: Self-pay | Admitting: Internal Medicine

## 2022-06-12 NOTE — Progress Notes (Deleted)
Subjective:    Patient ID: Joel Andersen, male    DOB: 1991/01/20, 32 y.o.   MRN: [id number]  HPI  Patient presents to clinic today for follow-up of chronic conditions.  GAD: Chronic, managed on Venlafaxine, Hydroxyzine and Buspirone.  He is not currently seeing a therapist.  He denies depression, SI/HI.  GERD: Triggered by.  He takes as needed with some relief of symptoms.  There is no upper GI on file.  HLD: His last LDL was 143, triglycerides 142, 10/2021.  He denies myalgias on Atorvastatin.  He does not consume low-fat diet.   Review of Systems     Past Medical History:  Diagnosis Date   Anxiety     Current Outpatient Medications  Medication Sig Dispense Refill   atorvastatin (LIPITOR) 10 MG tablet TAKE 1 TABLET BY MOUTH EVERY DAY 90 tablet 0   busPIRone (BUSPAR) 15 MG tablet TAKE 1 TABLET BY MOUTH 2 TIMES DAILY. 180 tablet 1   hydrOXYzine (VISTARIL) 50 MG capsule Take 1 capsule (50 mg total) by mouth daily as needed. 90 capsule 1   venlafaxine XR (EFFEXOR-XR) 150 MG 24 hr capsule Take 1 capsule (150 mg total) by mouth daily with breakfast. 90 capsule 1   venlafaxine XR (EFFEXOR-XR) 75 MG 24 hr capsule TAKE 1 CAPSULE BY MOUTH DAILY WITH BREAKFAST. 90 capsule 0   No current facility-administered medications for this visit.    Allergies  Allergen Reactions   Hydrocodone-Acetaminophen Rash    Family History  Problem Relation Age of Onset   Diabetes Mother    Hypertension Mother    Diabetes Maternal Grandmother    Hypertension Maternal Grandmother     Social History   Socioeconomic History   Marital status: Married    Spouse name: Not on file   Number of children: Not on file   Years of education: Not on file   Highest education level: Not on file  Occupational History   Not on file  Tobacco Use   Smoking status: Never   Smokeless tobacco: Current    Types: Chew  Vaping Use   Vaping Use: Never used  Substance and Sexual Activity   Alcohol use: Yes     Comment: rare   Drug use: Never   Sexual activity: Not on file  Other Topics Concern   Not on file  Social History Narrative   Not on file   Social Determinants of Health   Financial Resource Strain: Not on file  Food Insecurity: Not on file  Transportation Needs: Not on file  Physical Activity: Not on file  Stress: Not on file  Social Connections: Not on file  Intimate Partner Violence: Not on file     Constitutional: Denies fever, malaise, fatigue, headache or abrupt weight changes.  HEENT: Denies eye pain, eye redness, ear pain, ringing in the ears, wax buildup, runny nose, nasal congestion, bloody nose, or sore throat. Respiratory: Denies difficulty breathing, shortness of breath, cough or sputum production.   Cardiovascular: Denies chest pain, chest tightness, palpitations or swelling in the hands or feet.  Gastrointestinal: Denies abdominal pain, bloating, constipation, diarrhea or blood in the stool.  GU: Denies urgency, frequency, pain with urination, burning sensation, blood in urine, odor or discharge. Musculoskeletal: Denies decrease in range of motion, difficulty with gait, muscle pain or joint pain and swelling.  Skin: Denies redness, rashes, lesions or ulcercations.  Neurological: Denies dizziness, difficulty with memory, difficulty with speech or problems with balance and coordination.  Psych:  Patient has a history of anxiety.  Denies depression, SI/HI.  No other specific complaints in a complete review of systems (except as listed in HPI above).  Objective:   Physical Exam  There were no vitals taken for this visit. Wt Readings from Last 3 Encounters:  02/08/22 198 lb (89.8 kg)  12/21/21 194 lb (88 kg)  11/04/21 196 lb (88.9 kg)    General: Appears their stated age, well developed, well nourished in NAD. Skin: Warm, dry and intact. No rashes, lesions or ulcerations noted. HEENT: Head: normal shape and size; Eyes: sclera white, no icterus, conjunctiva pink,  PERRLA and EOMs intact; Ears: Tm's gray and intact, normal light reflex; Nose: mucosa pink and moist, septum midline; Throat/Mouth: Teeth present, mucosa pink and moist, no exudate, lesions or ulcerations noted.  Neck:  Neck supple, trachea midline. No masses, lumps or thyromegaly present.  Cardiovascular: Normal rate and rhythm. S1,S2 noted.  No murmur, rubs or gallops noted. No JVD or BLE edema. No carotid bruits noted. Pulmonary/Chest: Normal effort and positive vesicular breath sounds. No respiratory distress. No wheezes, rales or ronchi noted.  Abdomen: Soft and nontender. Normal bowel sounds. No distention or masses noted. Liver, spleen and kidneys non palpable. Musculoskeletal: Normal range of motion. No signs of joint swelling. No difficulty with gait.  Neurological: Alert and oriented. Cranial nerves II-XII grossly intact. Coordination normal.  Psychiatric: Mood and affect normal. Behavior is normal. Judgment and thought content normal.     BMET    Component Value Date/Time   NA 139 11/04/2021 1429   K 4.0 11/04/2021 1429   CL 105 11/04/2021 1429   CO2 27 11/04/2021 1429   GLUCOSE 77 11/04/2021 1429   BUN 8 11/04/2021 1429   CREATININE 0.83 11/04/2021 1429   CALCIUM 9.5 11/04/2021 1429   GFRNONAA NOT CALCULATED 06/26/2007 1457   GFRAA  06/26/2007 1457    NOT CALCULATED        The eGFR has been calculated using the MDRD equation. This calculation has not been validated in all clinical    Lipid Panel     Component Value Date/Time   CHOL 202 (H) 11/04/2021 1429   TRIG 142 11/04/2021 1429   HDL 32 (L) 11/04/2021 1429   CHOLHDL 6.3 (H) 11/04/2021 1429   VLDL 43.8 (H) 05/11/2020 1445   LDLCALC 143 (H) 11/04/2021 1429    CBC    Component Value Date/Time   WBC 8.4 11/04/2021 1429   RBC 4.83 11/04/2021 1429   HGB 15.3 11/04/2021 1429   HCT 43.5 11/04/2021 1429   PLT 307 11/04/2021 1429   MCV 90.1 11/04/2021 1429   MCH 31.7 11/04/2021 1429   MCHC 35.2 11/04/2021  1429   RDW 13.0 11/04/2021 1429   LYMPHSABS 1.7 06/26/2007 1457   MONOABS 0.7 06/26/2007 1457   EOSABS 0.0 06/26/2007 1457   BASOSABS 0.1 06/26/2007 1457    Hgb A1C Lab Results  Component Value Date   HGBA1C 5.0 11/04/2021            Assessment & Plan:     RTC in 6 months for annual exam Nicki Reaper, NP

## 2022-06-25 ENCOUNTER — Other Ambulatory Visit: Payer: Self-pay | Admitting: Internal Medicine

## 2022-06-27 NOTE — Telephone Encounter (Signed)
Requested Prescriptions  Pending Prescriptions Disp Refills   venlafaxine XR (EFFEXOR-XR) 150 MG 24 hr capsule [Pharmacy Med Name: VENLAFAXINE HCL ER 150 MG CAP] 90 capsule 0    Sig: TAKE 1 CAPSULE BY MOUTH DAILY WITH BREAKFAST.     Psychiatry: Antidepressants - SNRI - desvenlafaxine & venlafaxine Failed - 06/25/2022  8:44 AM      Failed - Lipid Panel in normal range within the last 12 months    Cholesterol  Date Value Ref Range Status  11/04/2021 202 (H) <200 mg/dL Final   LDL Cholesterol (Calc)  Date Value Ref Range Status  11/04/2021 143 (H) mg/dL (calc) Final    Comment:    Reference range: <100 . Desirable range <100 mg/dL for primary prevention;   <70 mg/dL for patients with CHD or diabetic patients  with > or = 2 CHD risk factors. Marland Kitchen LDL-C is now calculated using the Martin-Hopkins  calculation, which is a validated novel method providing  better accuracy than the Friedewald equation in the  estimation of LDL-C.  Horald Pollen et al. Lenox Ahr. 1610;960(45): 2061-2068  (http://education.[facility].com/faq/FAQ164)    Direct LDL  Date Value Ref Range Status  05/11/2020 154.0 mg/dL Final    Comment:    Optimal:  <100 mg/dLNear or Above Optimal:  100-129 mg/dLBorderline High:  130-159 mg/dLHigh:  160-189 mg/dLVery High:  >190 mg/dL   HDL  Date Value Ref Range Status  11/04/2021 32 (L) > OR = 40 mg/dL Final   Triglycerides  Date Value Ref Range Status  11/04/2021 142 <150 mg/dL Final         Passed - Cr in normal range and within 360 days    Creat  Date Value Ref Range Status  11/04/2021 0.83 0.60 - 1.26 mg/dL Final         Passed - Last BP in normal range    BP Readings from Last 1 Encounters:  02/08/22 132/84         Passed - Valid encounter within last 6 months    Recent Outpatient Visits           4 months ago GAD (generalized anxiety disorder)   [facility] [place], Salvadore Oxford, NP   6 months ago COVID-19   [facility] [place], Salvadore Oxford, NP   7 months ago Encounter for general adult medical examination with abnormal findings   [place] [place], Salvadore Oxford, NP   1 year ago Mixed hyperlipidemia   [place] [place], Salvadore Oxford, [place]

## 2022-08-05 ENCOUNTER — Other Ambulatory Visit: Payer: Self-pay | Admitting: Internal Medicine

## 2022-08-07 NOTE — Telephone Encounter (Signed)
Requested medication (s) are due for refill today - yes  Requested medication (s) are on the active medication list -yes  Future visit scheduled -no  Last refill: 05/09/22 #90  Notes to clinic: Patient has canceled/missed multiple appointments for follow up- sent for review   Requested Prescriptions  Pending Prescriptions Disp Refills   venlafaxine XR (EFFEXOR-XR) 75 MG 24 hr capsule [Pharmacy Med Name: VENLAFAXINE HCL ER 75 MG CAP] 90 capsule 0    Sig: TAKE 1 CAPSULE BY MOUTH DAILY WITH BREAKFAST.     Psychiatry: Antidepressants - SNRI - desvenlafaxine & venlafaxine Failed - 08/05/2022  8:48 AM      Failed - Lipid Panel in normal range within the last 12 months    Cholesterol  Date Value Ref Range Status  11/04/2021 202 (H) <200 mg/dL Final   LDL Cholesterol (Calc)  Date Value Ref Range Status  11/04/2021 143 (H) mg/dL (calc) Final    Comment:    Reference range: <100 . Desirable range <100 mg/dL for primary prevention;   <70 mg/dL for patients with CHD or diabetic patients  with > or = 2 CHD risk factors. Marland Kitchen LDL-C is now calculated using the Martin-Hopkins  calculation, which is a validated novel method providing  better accuracy than the Friedewald equation in the  estimation of LDL-C.  Horald Pollen et al. Lenox Ahr. 1610;960(45): 2061-2068  (http://education.[facility].com/faq/FAQ164)    Direct LDL  Date Value Ref Range Status  05/11/2020 154.0 mg/dL Final    Comment:    Optimal:  <100 mg/dLNear or Above Optimal:  100-129 mg/dLBorderline High:  130-159 mg/dLHigh:  160-189 mg/dLVery High:  >190 mg/dL   HDL  Date Value Ref Range Status  11/04/2021 32 (L) > OR = 40 mg/dL Final   Triglycerides  Date Value Ref Range Status  11/04/2021 142 <150 mg/dL Final         Passed - Cr in normal range and within 360 days    Creat  Date Value Ref Range Status  11/04/2021 0.83 0.60 - 1.26 mg/dL Final         Passed - Last BP in normal range    BP Readings from Last 1  Encounters:  02/08/22 132/84         Passed - Valid encounter within last 6 months    Recent Outpatient Visits           6 months ago GAD (generalized anxiety disorder)   [facility] [place], Salvadore Oxford, NP   7 months ago COVID-19   [facility] [place], Salvadore Oxford, NP   9 months ago Encounter for general adult medical examination with abnormal findings   [place] [facility] [place], Salvadore Oxford, NP   2 years ago Mixed hyperlipidemia   [facility] [place], Salvadore Oxford, NP                 Requested Prescriptions  Pending Prescriptions Disp Refills   venlafaxine XR (EFFEXOR-XR) 75 MG 24 hr capsule [Pharmacy Med Name: VENLAFAXINE HCL ER 75 MG CAP] 90 capsule 0    Sig: TAKE 1 CAPSULE BY MOUTH DAILY WITH BREAKFAST.     Psychiatry: Antidepressants - SNRI - desvenlafaxine & venlafaxine Failed - 08/05/2022  8:48 AM      Failed - Lipid Panel in normal range within the last 12 months    Cholesterol  Date Value Ref Range Status  11/04/2021 202 (H) <200  mg/dL Final   LDL Cholesterol (Calc)  Date Value Ref Range Status  11/04/2021 143 (H) mg/dL (calc) Final    Comment:    Reference range: <100 . Desirable range <100 mg/dL for primary prevention;   <70 mg/dL for patients with CHD or diabetic patients  with > or = 2 CHD risk factors. Marland Kitchen LDL-C is now calculated using the Martin-Hopkins  calculation, which is a validated novel method providing  better accuracy than the Friedewald equation in the  estimation of LDL-C.  Horald Pollen et al. Lenox Ahr. 1610;960(45): 2061-2068  (http://education.[facility].com/faq/FAQ164)    Direct LDL  Date Value Ref Range Status  05/11/2020 154.0 mg/dL Final    Comment:    Optimal:  <100 mg/dLNear or Above Optimal:  100-129 mg/dLBorderline High:  130-159 mg/dLHigh:  160-189 mg/dLVery High:  >190 mg/dL   HDL  Date Value Ref Range Status  11/04/2021 32 (L) > OR  = 40 mg/dL Final   Triglycerides  Date Value Ref Range Status  11/04/2021 142 <150 mg/dL Final         Passed - Cr in normal range and within 360 days    Creat  Date Value Ref Range Status  11/04/2021 0.83 0.60 - 1.26 mg/dL Final         Passed - Last BP in normal range    BP Readings from Last 1 Encounters:  02/08/22 132/84         Passed - Valid encounter within last 6 months    Recent Outpatient Visits           6 months ago GAD (generalized anxiety disorder)   [place] [facility] [place], Salvadore Oxford, NP   7 months ago COVID-19   [facility] [place], Salvadore Oxford, NP   9 months ago Encounter for general adult medical examination with abnormal findings   [place] [facility] [place], Salvadore Oxford, NP   2 years ago Mixed hyperlipidemia   [place] [facility] [place], Salvadore Oxford, [place]

## 2022-08-25 ENCOUNTER — Other Ambulatory Visit: Payer: Self-pay | Admitting: Internal Medicine

## 2022-08-25 NOTE — Telephone Encounter (Signed)
Requested Prescriptions  Refused Prescriptions Disp Refills   busPIRone (BUSPAR) 15 MG tablet [Pharmacy Med Name: BUSPIRONE HCL 15 MG TABLET] 180 tablet 2    Sig: TAKE 1 TABLET BY MOUTH TWICE A DAY     Psychiatry: Anxiolytics/Hypnotics - Non-controlled Passed - 08/25/2022 11:32 AM      Passed - Valid encounter within last 12 months    Recent Outpatient Visits           6 months ago GAD (generalized anxiety disorder)   [place] [facility] [place], Salvadore Oxford, NP   8 months ago COVID-19   [facility] [place], Salvadore Oxford, NP   9 months ago Encounter for general adult medical examination with abnormal findings   [place] [facility] [place], Salvadore Oxford, NP   2 years ago Mixed hyperlipidemia   [place] [facility] [place], Salvadore Oxford, [place]

## 2022-09-29 ENCOUNTER — Other Ambulatory Visit: Payer: Self-pay | Admitting: Internal Medicine

## 2022-10-02 NOTE — Telephone Encounter (Signed)
Last RF 05/09/22 #90  Last OV 1/24 Needs appt and CR

## 2022-10-02 NOTE — Telephone Encounter (Signed)
Requested medication (s) are due for refill today: yes  Requested medication (s) are on the active medication list: yes  Last refill:  05/09/22  Future visit scheduled: no  Notes to clinic:  Unable to refill per protocol, courtesy refill already given, routing for provider approval.      Requested Prescriptions  Pending Prescriptions Disp Refills   venlafaxine XR (EFFEXOR-XR) 75 MG 24 hr capsule [Pharmacy Med Name: VENLAFAXINE HCL ER 75 MG CAP] 90 capsule 0    Sig: TAKE 1 CAPSULE BY MOUTH DAILY WITH BREAKFAST.     Psychiatry: Antidepressants - SNRI - desvenlafaxine & venlafaxine Failed - 09/29/2022  7:02 PM      Failed - Valid encounter within last 6 months    Recent Outpatient Visits           7 months ago GAD (generalized anxiety disorder)   [place] [facility] [place], Salvadore Oxford, NP   9 months ago COVID-19   [facility] [place], Salvadore Oxford, NP   11 months ago Encounter for general adult medical examination with abnormal findings   [place] [facility] [place], Salvadore Oxford, NP   2 years ago Mixed hyperlipidemia   [place] [facility] [place], [place], NP              Failed - Lipid Panel in normal range within the last 12 months    Cholesterol  Date Value Ref Range Status  11/04/2021 202 (H) <200 mg/dL Final   LDL Cholesterol (Calc)  Date Value Ref Range Status  11/04/2021 143 (H) mg/dL (calc) Final    Comment:    Reference range: <100 . Desirable range <100 mg/dL for primary prevention;   <70 mg/dL for patients with CHD or diabetic patients  with > or = 2 CHD risk factors. Marland Kitchen LDL-C is now calculated using the Martin-Hopkins  calculation, which is a validated novel method providing  better accuracy than the Friedewald equation in the  estimation of LDL-C.  Horald Pollen et al. Lenox Ahr. 9604;540(98): 2061-2068  (http://education.[facility].com/faq/FAQ164)    Direct LDL  Date Value Ref  Range Status  05/11/2020 154.0 mg/dL Final    Comment:    Optimal:  <100 mg/dLNear or Above Optimal:  100-129 mg/dLBorderline High:  130-159 mg/dLHigh:  160-189 mg/dLVery High:  >190 mg/dL   HDL  Date Value Ref Range Status  11/04/2021 32 (L) > OR = 40 mg/dL Final   Triglycerides  Date Value Ref Range Status  11/04/2021 142 <150 mg/dL Final         Passed - Cr in normal range and within 360 days    Creat  Date Value Ref Range Status  11/04/2021 0.83 0.60 - 1.26 mg/dL Final         Passed - Last BP in normal range    BP Readings from Last 1 Encounters:  02/08/22 132/84

## 2022-10-28 ENCOUNTER — Other Ambulatory Visit: Payer: Self-pay | Admitting: Internal Medicine

## 2022-10-30 NOTE — Telephone Encounter (Signed)
Requested Prescriptions  Pending Prescriptions Disp Refills   venlafaxine XR (EFFEXOR-XR) 150 MG 24 hr capsule [Pharmacy Med Name: VENLAFAXINE HCL ER 150 MG CAP] 90 capsule 0    Sig: TAKE 1 CAPSULE BY MOUTH DAILY WITH BREAKFAST.     Psychiatry: Antidepressants - SNRI - desvenlafaxine & venlafaxine Failed - 10/28/2022  8:29 AM      Failed - Valid encounter within last 6 months    Recent Outpatient Visits           8 months ago GAD (generalized anxiety disorder)   [facility] [place], Salvadore [place], NP   10 months ago COVID-19   [facility] [place], Salvadore Oxford, NP   12 months ago Encounter for general adult medical examination with abnormal findings   [place] [facility] [place], Salvadore Oxford, NP   2 years ago Mixed hyperlipidemia   [place] [facility] [place], [place], NP              Failed - Lipid Panel in normal range within the last 12 months    Cholesterol  Date Value Ref Range Status  11/04/2021 202 (H) <200 mg/dL Final   LDL Cholesterol (Calc)  Date Value Ref Range Status  11/04/2021 143 (H) mg/dL (calc) Final    Comment:    Reference range: <100 . Desirable range <100 mg/dL for primary prevention;   <70 mg/dL for patients with CHD or diabetic patients  with > or = 2 CHD risk factors. Marland Kitchen LDL-C is now calculated using the Martin-Hopkins  calculation, which is a validated novel method providing  better accuracy than the Friedewald equation in the  estimation of LDL-C.  Horald Pollen et al. Lenox Ahr. 1610;960(45): 2061-2068  (http://education.[facility].com/faq/FAQ164)    Direct LDL  Date Value Ref Range Status  05/11/2020 154.0 mg/dL Final    Comment:    Optimal:  <100 mg/dLNear or Above Optimal:  100-129 mg/dLBorderline High:  130-159 mg/dLHigh:  160-189 mg/dLVery High:  >190 mg/dL   HDL  Date Value Ref Range Status  11/04/2021 32 (L) > OR = 40 mg/dL Final   Triglycerides  Date  Value Ref Range Status  11/04/2021 142 <150 mg/dL Final         Passed - Cr in normal range and within 360 days    Creat  Date Value Ref Range Status  11/04/2021 0.83 0.60 - 1.26 mg/dL Final         Passed - Last BP in normal range    BP Readings from Last 1 Encounters:  02/08/22 132/84

## 2022-11-23 ENCOUNTER — Ambulatory Visit (INDEPENDENT_AMBULATORY_CARE_PROVIDER_SITE_OTHER): Payer: 59 | Admitting: Internal Medicine

## 2022-11-23 ENCOUNTER — Encounter: Payer: Self-pay | Admitting: Internal Medicine

## 2022-11-23 VITALS — BP 138/82 | HR 112 | Ht 70.0 in | Wt 203.0 lb

## 2022-11-23 DIAGNOSIS — J111 Influenza due to unidentified influenza virus with other respiratory manifestations: Secondary | ICD-10-CM | POA: Diagnosis not present

## 2022-11-23 DIAGNOSIS — H6993 Unspecified Eustachian tube disorder, bilateral: Secondary | ICD-10-CM | POA: Diagnosis not present

## 2022-11-23 MED ORDER — VENLAFAXINE HCL ER 75 MG PO CP24: 75.0000 mg | ORAL_CAPSULE | Freq: Every day | ORAL | 0 refills | Status: DC

## 2022-11-23 MED ORDER — BUSPIRONE HCL 15 MG PO TABS: 15.0000 mg | ORAL_TABLET | Freq: Two times a day (BID) | ORAL | 0 refills | Status: DC

## 2022-11-23 NOTE — Patient Instructions (Signed)
Eustachian Tube Dysfunction  Eustachian tube dysfunction refers to a condition in which a blockage develops in the narrow passage that connects the middle ear to the back of the nose (eustachian tube). The eustachian tube regulates air pressure in the middle ear by letting air move between the ear and nose. It also helps to drain fluid from the middle ear space. Eustachian tube dysfunction can affect one or both ears. When the eustachian tube does not function properly, air pressure, fluid, or both can build up in the middle ear. What are the causes? This condition occurs when the eustachian tube becomes blocked or cannot open normally. Common causes of this condition include: Ear infections. Colds and other infections that affect the nose, mouth, and throat (upper respiratory tract). Allergies. Irritation from cigarette smoke. Irritation from stomach acid coming up into the esophagus (gastroesophageal reflux). The esophagus is the part of the body that moves food from the mouth to the stomach. Sudden changes in air pressure, such as from descending in an airplane or scuba diving. Abnormal growths in the nose or throat, such as: Growths that line the nose (nasal polyps). Abnormal growth of cells (tumors). Enlarged tissue at the back of the throat (adenoids). What increases the risk? You are more likely to develop this condition if: You smoke. You are overweight. You are a child who has: Certain birth defects of the mouth, such as cleft palate. Large tonsils or adenoids. What are the signs or symptoms? Common symptoms of this condition include: A feeling of fullness in the ear. Ear pain. Clicking or popping noises in the ear. Ringing in the ear (tinnitus). Hearing loss. Loss of balance. Dizziness. Symptoms may get worse when the air pressure around you changes, such as when you travel to an area of high elevation, fly on an airplane, or go scuba diving. How is this diagnosed? This  condition may be diagnosed based on: Your symptoms. A physical exam of your ears, nose, and throat. Tests, such as those that measure: The movement of your eardrum. Your hearing (audiometry). How is this treated? Treatment depends on the cause and severity of your condition. In mild cases, you may relieve your symptoms by moving air into your ears. This is called "popping the ears." In more severe cases, or if you have symptoms of fluid in your ears, treatment may include: Medicines to relieve congestion (decongestants). Medicines that treat allergies (antihistamines). Nasal sprays or ear drops that contain medicines that reduce swelling (steroids). A procedure to drain the fluid in your eardrum. In this procedure, a small tube may be placed in the eardrum to: Drain the fluid. Restore the air in the middle ear space. A procedure to insert a balloon device through the nose to inflate the opening of the eustachian tube (balloon dilation). Follow these instructions at home: Lifestyle Do not do any of the following until your health care provider approves: Travel to high altitudes. Fly in airplanes. Work in a Estate agent or room. Scuba dive. Do not use any products that contain nicotine or tobacco. These products include cigarettes, chewing tobacco, and vaping devices, such as e-cigarettes. If you need help quitting, ask your health care provider. Keep your ears dry. Wear fitted earplugs during showering and bathing. Dry your ears completely after. General instructions Take over-the-counter and prescription medicines only as told by your health care provider. Use techniques to help pop your ears as recommended by your health care provider. These may include: Chewing gum. Yawning. Frequent, forceful swallowing.  Closing your mouth, holding your nose closed, and gently blowing as if you are trying to blow air out of your nose. Keep all follow-up visits. This is important. Contact a  health care provider if: Your symptoms do not go away after treatment. Your symptoms come back after treatment. You are unable to pop your ears. You have: A fever. Pain in your ear. Pain in your head or neck. Fluid draining from your ear. Your hearing suddenly changes. You become very dizzy. You lose your balance. Get help right away if: You have a sudden, severe increase in any of your symptoms. Summary Eustachian tube dysfunction refers to a condition in which a blockage develops in the eustachian tube. It can be caused by ear infections, allergies, inhaled irritants, or abnormal growths in the nose or throat. Symptoms may include ear pain or fullness, hearing loss, or ringing in the ears. Mild cases are treated with techniques to unblock the ears, such as yawning or chewing gum. More severe cases are treated with medicines or procedures. This information is not intended to replace advice given to you by your health care provider. Make sure you discuss any questions you have with your health care provider. Document Revised: 04/05/2020 Document Reviewed: 04/05/2020 Elsevier Patient Education  2024 [employer].

## 2022-11-23 NOTE — Progress Notes (Signed)
Subjective:    Patient ID: YER OLIVENCIA, male    DOB: November 03, 1990, 32 y.o.   MRN: [id number]  HPI  Pt presents to the clinic today with complaint of headache, nasal congestion, ear pain, sore throat, cough, nausea and diarrhea.  He reports this started 4 to 5 days ago.  He is not blowing any mucus out of his nose.  He denies ear drainage but does report some muffled hearing.  He denies difficulty swallowing.  The cough is mostly nonproductive.  He denies runny nose, shortness of breath for vomiting.  He denies fever but has had chills and bodyaches.  He went to urgent care, tested positive for flu. He was given benzonate, cough syrup.  He reports worsening ear pain is concerned about ear infection.  He has not tried anything additional OTC.  Review of Systems     Past Medical History:  Diagnosis Date   Anxiety     Current Outpatient Medications  Medication Sig Dispense Refill   atorvastatin (LIPITOR) 10 MG tablet TAKE 1 TABLET BY MOUTH EVERY DAY 90 tablet 0   busPIRone (BUSPAR) 15 MG tablet TAKE 1 TABLET BY MOUTH 2 TIMES DAILY. 180 tablet 1   hydrOXYzine (VISTARIL) 50 MG capsule Take 1 capsule (50 mg total) by mouth daily as needed. 90 capsule 1   venlafaxine XR (EFFEXOR-XR) 150 MG 24 hr capsule TAKE 1 CAPSULE BY MOUTH DAILY WITH BREAKFAST. 90 capsule 0   venlafaxine XR (EFFEXOR-XR) 75 MG 24 hr capsule TAKE 1 CAPSULE BY MOUTH DAILY WITH BREAKFAST. 90 capsule 0   No current facility-administered medications for this visit.    Allergies  Allergen Reactions   Hydrocodone-Acetaminophen Rash    Family History  Problem Relation Age of Onset   Diabetes Mother    Hypertension Mother    Diabetes Maternal Grandmother    Hypertension Maternal Grandmother     Social History   Socioeconomic History   Marital status: Married    Spouse name: Not on file   Number of children: Not on file   Years of education: Not on file   Highest education level: Not on file  Occupational History    Not on file  Tobacco Use   Smoking status: Never   Smokeless tobacco: Current    Types: Chew  Vaping Use   Vaping status: Never Used  Substance and Sexual Activity   Alcohol use: Yes    Comment: rare   Drug use: Never   Sexual activity: Not on file  Other Topics Concern   Not on file  Social History Narrative   Not on file   Social Determinants of Health   Financial Resource Strain: Not on file  Food Insecurity: Not on file  Transportation Needs: Not on file  Physical Activity: Not on file  Stress: Not on file  Social Connections: Not on file  Intimate Partner Violence: Not on file     Constitutional: Patient reports headache.  Denies fever, malaise, fatigue, or abrupt weight changes.  HEENT: Patient reports ear pain, nasal congestion and sore throat.  Denies eye pain, eye redness, ringing in the ears, wax buildup, runny nose,  bloody nose. Respiratory: Patient reports cough.  Denies difficulty breathing, shortness of breath, or sputum production.   Cardiovascular: Denies chest pain, chest tightness, palpitations or swelling in the hands or feet.  Gastrointestinal: Patient reports nausea and diarrhea.  Denies abdominal pain, bloating, constipation, or blood in the stool.  Neurological: Denies dizziness, difficulty with memory,  difficulty with speech or problems with balance and coordination.    No other specific complaints in a complete review of systems (except as listed in HPI above).  Objective:   Physical Exam BP 138/82   Pulse (!) 112   Ht 5\' 10"  (1.778 m)   Wt 203 lb (92.1 kg)   SpO2 100%   BMI 29.13 kg/m   Wt Readings from Last 3 Encounters:  02/08/22 198 lb (89.8 kg)  12/21/21 194 lb (88 kg)  11/04/21 196 lb (88.9 kg)    General: Appears his stated age, appears unwell but in NAD. Skin: Warm, dry and intact. No rashes ulcerations noted. HEENT: Head: normal shape and size, no sinus tenderness noted; Eyes: sclera white, no icterus, conjunctiva pink,  PERRLA and EOMs intact; Ears: Tm's gray and intact, normal light reflex, + serous effusion bilaterally; Nose: mucosa boggy and moist, turbinates swollen, septum midline; Throat/Mouth: Teeth present, mucosa erythematous and moist, no exudate, lesions or ulcerations noted.  Neck: No adenopathy noted. Cardiovascular: Tachycardic with normal rhythm. S1,S2 noted.  No murmur, rubs or gallops noted Pulmonary/Chest: Normal effort and positive vesicular breath sounds. No respiratory distress. No wheezes, rales or ronchi noted.  Neurological: Alert and oriented.  BMET    Component Value Date/Time   NA 139 11/04/2021 1429   K 4.0 11/04/2021 1429   CL 105 11/04/2021 1429   CO2 27 11/04/2021 1429   GLUCOSE 77 11/04/2021 1429   BUN 8 11/04/2021 1429   CREATININE 0.83 11/04/2021 1429   CALCIUM 9.5 11/04/2021 1429   GFRNONAA NOT CALCULATED 06/26/2007 1457   GFRAA  06/26/2007 1457    NOT CALCULATED        The eGFR has been calculated using the MDRD equation. This calculation has not been validated in all clinical    Lipid Panel     Component Value Date/Time   CHOL 202 (H) 11/04/2021 1429   TRIG 142 11/04/2021 1429   HDL 32 (L) 11/04/2021 1429   CHOLHDL 6.3 (H) 11/04/2021 1429   VLDL 43.8 (H) 05/11/2020 1445   LDLCALC 143 (H) 11/04/2021 1429    CBC    Component Value Date/Time   WBC 8.4 11/04/2021 1429   RBC 4.83 11/04/2021 1429   HGB 15.3 11/04/2021 1429   HCT 43.5 11/04/2021 1429   PLT 307 11/04/2021 1429   MCV 90.1 11/04/2021 1429   MCH 31.7 11/04/2021 1429   MCHC 35.2 11/04/2021 1429   RDW 13.0 11/04/2021 1429   LYMPHSABS 1.7 06/26/2007 1457   MONOABS 0.7 06/26/2007 1457   EOSABS 0.0 06/26/2007 1457   BASOSABS 0.1 06/26/2007 1457    Hgb A1C Lab Results  Component Value Date   HGBA1C 5.0 11/04/2021            Assessment & Plan:   Urgent care follow-up for influenza:  Urgent care notes and labs reviewed He was not treated with antiviral therapy Continue  Tessalon and cough syrup OTC as previously prescribed  ETD bilateral:  Antibiotics not indicated as there is no sign of infection Recommend Flonase 1 spray each nostril twice daily x 5 days Advised advised to try Sudafed as directed on the box OTC  Schedule an appt for follow up chronic conditions Nicki Reaper, NP

## 2023-02-18 ENCOUNTER — Other Ambulatory Visit: Payer: Self-pay | Admitting: Internal Medicine

## 2023-02-19 ENCOUNTER — Other Ambulatory Visit: Payer: Self-pay | Admitting: Internal Medicine

## 2023-02-20 NOTE — Telephone Encounter (Signed)
 Requested medication (s) are due for refill today: Yes  Requested medication (s) are on the active medication list: Yes  Last refill:  11/23/22  Future visit scheduled: No  Notes to clinic:  Unable to refill per protocol, appointment needed. Patient notified via MyChart.    Requested Prescriptions  Pending Prescriptions Disp Refills   venlafaxine  XR (EFFEXOR -XR) 75 MG 24 hr capsule [Pharmacy Med Name: VENLAFAXINE  HCL ER 75 MG CAP] 90 capsule 0    Sig: TAKE 1 CAPSULE BY MOUTH DAILY WITH BREAKFAST.     Psychiatry: Antidepressants - SNRI - desvenlafaxine & venlafaxine  Failed - 02/20/2023 12:36 PM      Failed - Cr in normal range and within 360 days    Creat  Date Value Ref Range Status  11/04/2021 0.83 0.60 - 1.26 mg/dL Final         Failed - Lipid Panel in normal range within the last 12 months    Cholesterol  Date Value Ref Range Status  11/04/2021 202 (H) <200 mg/dL Final   LDL Cholesterol (Calc)  Date Value Ref Range Status  11/04/2021 143 (H) mg/dL (calc) Final    Comment:    Reference range: <100 . Desirable range <100 mg/dL for primary prevention;   <70 mg/dL for patients with CHD or diabetic patients  with > or = 2 CHD risk factors. SABRA LDL-C is now calculated using the Martin-Hopkins  calculation, which is a validated novel method providing  better accuracy than the Friedewald equation in the  estimation of LDL-C.  Gladis APPLETHWAITE et al. SANDREA. 7986;689(80): 2061-2068  (http://education.[facility].com/faq/FAQ164)    Direct LDL  Date Value Ref Range Status  05/11/2020 154.0 mg/dL Final    Comment:    Optimal:  <100 mg/dLNear or Above Optimal:  100-129 mg/dLBorderline High:  130-159 mg/dLHigh:  160-189 mg/dLVery High:  >190 mg/dL   HDL  Date Value Ref Range Status  11/04/2021 32 (L) > OR = 40 mg/dL Final   Triglycerides  Date Value Ref Range Status  11/04/2021 142 <150 mg/dL Final         Passed - Last BP in normal range    BP Readings from Last 1  Encounters:  11/23/22 138/82         Passed - Valid encounter within last 6 months    Recent Outpatient Visits           2 months ago Influenza   [facility] [place], Angeline ORN, NP   1 year ago GAD (generalized anxiety disorder)   [facility] [place], Angeline ORN, NP   1 year ago COVID-19   [facility] [place], Angeline ORN, NP   1 year ago Encounter for general adult medical examination with abnormal findings   [place] [facility] [place], Angeline ORN, NP   2 years ago Mixed hyperlipidemia   [facility] [place], Angeline ORN, [place]

## 2023-02-20 NOTE — Telephone Encounter (Signed)
 Requested medications are due for refill today.  yes  Requested medications are on the active medications list.  yes  Last refill. 10/30/2022 #90 0 rf  Future visit scheduled.   no  Notes to clinic.  Pt has rx's for 2 different strengths of this medication. Labs are expired. Please review.    Requested Prescriptions  Pending Prescriptions Disp Refills   venlafaxine  XR (EFFEXOR -XR) 150 MG 24 hr capsule [Pharmacy Med Name: VENLAFAXINE  HCL ER 150 MG CAP] 90 capsule 0    Sig: TAKE 1 CAPSULE BY MOUTH DAILY WITH BREAKFAST.     Psychiatry: Antidepressants - SNRI - desvenlafaxine & venlafaxine  Failed - 02/20/2023 10:58 AM      Failed - Cr in normal range and within 360 days    Creat  Date Value Ref Range Status  11/04/2021 0.83 0.60 - 1.26 mg/dL Final         Failed - Lipid Panel in normal range within the last 12 months    Cholesterol  Date Value Ref Range Status  11/04/2021 202 (H) <200 mg/dL Final   LDL Cholesterol (Calc)  Date Value Ref Range Status  11/04/2021 143 (H) mg/dL (calc) Final    Comment:    Reference range: <100 . Desirable range <100 mg/dL for primary prevention;   <70 mg/dL for patients with CHD or diabetic patients  with > or = 2 CHD risk factors. SABRA LDL-C is now calculated using the Martin-Hopkins  calculation, which is a validated novel method providing  better accuracy than the Friedewald equation in the  estimation of LDL-C.  Gladis APPLETHWAITE et al. SANDREA. 7986;689(80): 2061-2068  (http://education.[facility].com/faq/FAQ164)    Direct LDL  Date Value Ref Range Status  05/11/2020 154.0 mg/dL Final    Comment:    Optimal:  <100 mg/dLNear or Above Optimal:  100-129 mg/dLBorderline High:  130-159 mg/dLHigh:  160-189 mg/dLVery High:  >190 mg/dL   HDL  Date Value Ref Range Status  11/04/2021 32 (L) > OR = 40 mg/dL Final   Triglycerides  Date Value Ref Range Status  11/04/2021 142 <150 mg/dL Final         Passed - Last BP in normal range    BP  Readings from Last 1 Encounters:  11/23/22 138/82         Passed - Valid encounter within last 6 months    Recent Outpatient Visits           2 months ago Influenza   [facility] [facility] [place], Angeline ORN, NP   1 year ago GAD (generalized anxiety disorder)   [facility] [place], Angeline ORN, NP   1 year ago COVID-19   [facility] [place], Angeline ORN, NP   1 year ago Encounter for general adult medical examination with abnormal findings   [place] [facility] [place], Angeline ORN, NP   2 years ago Mixed hyperlipidemia    [facility], Angeline ORN, [place]

## 2023-05-29 ENCOUNTER — Other Ambulatory Visit: Payer: Self-pay | Admitting: Internal Medicine

## 2023-05-30 NOTE — Telephone Encounter (Signed)
 Requested Prescriptions  Pending Prescriptions Disp Refills   venlafaxine  XR (EFFEXOR -XR) 75 MG 24 hr capsule [Pharmacy Med Name: VENLAFAXINE  HCL ER 75 MG CAP] 90 capsule 0    Sig: TAKE 1 CAPSULE BY MOUTH DAILY WITH BREAKFAST.     Psychiatry: Antidepressants - SNRI - desvenlafaxine & venlafaxine  Failed - 05/30/2023  9:44 AM      Failed - Cr in normal range and within 360 days    Creat  Date Value Ref Range Status  11/04/2021 0.83 0.60 - 1.26 mg/dL Final         Failed - Valid encounter within last 6 months    Recent Outpatient Visits   None            Failed - Lipid Panel in normal range within the last 12 months    Cholesterol  Date Value Ref Range Status  11/04/2021 202 (H) <200 mg/dL Final   LDL Cholesterol (Calc)  Date Value Ref Range Status  11/04/2021 143 (H) mg/dL (calc) Final    Comment:    Reference range: <100 . Desirable range <100 mg/dL for primary prevention;   <70 mg/dL for patients with CHD or diabetic patients  with > or = 2 CHD risk factors. Joel Andersen LDL-C is now calculated using the Martin-Hopkins  calculation, which is a validated novel method providing  better accuracy than the Friedewald equation in the  estimation of LDL-C.  Joel Andersen et al. Joel Andersen. 7425;956(38): 2061-2068  (http://education.[facility].com/faq/FAQ164)    Direct LDL  Date Value Ref Range Status  05/11/2020 154.0 mg/dL Final    Comment:    Optimal:  <100 mg/dLNear or Above Optimal:  100-129 mg/dLBorderline High:  130-159 mg/dLHigh:  160-189 mg/dLVery High:  >190 mg/dL   HDL  Date Value Ref Range Status  11/04/2021 32 (L) > OR = 40 mg/dL Final   Triglycerides  Date Value Ref Range Status  11/04/2021 142 <150 mg/dL Final         Passed - Last BP in normal range    BP Readings from Last 1 Encounters:  11/23/22 138/82          venlafaxine  XR (EFFEXOR -XR) 150 MG 24 hr capsule [Pharmacy Med Name: VENLAFAXINE  HCL ER 150 MG CAP] 90 capsule 0    Sig: TAKE 1 CAPSULE BY MOUTH  DAILY WITH BREAKFAST.     Psychiatry: Antidepressants - SNRI - desvenlafaxine & venlafaxine  Failed - 05/30/2023  9:44 AM      Failed - Cr in normal range and within 360 days    Creat  Date Value Ref Range Status  11/04/2021 0.83 0.60 - 1.26 mg/dL Final         Failed - Valid encounter within last 6 months    Recent Outpatient Visits   None            Failed - Lipid Panel in normal range within the last 12 months    Cholesterol  Date Value Ref Range Status  11/04/2021 202 (H) <200 mg/dL Final   LDL Cholesterol (Calc)  Date Value Ref Range Status  11/04/2021 143 (H) mg/dL (calc) Final    Comment:    Reference range: <100 . Desirable range <100 mg/dL for primary prevention;   <70 mg/dL for patients with CHD or diabetic patients  with > or = 2 CHD risk factors. Joel Andersen LDL-C is now calculated using the Martin-Hopkins  calculation, which is a validated novel method providing  better accuracy than the Friedewald equation in the  estimation  of LDL-C.  Joel Andersen et al. Joel Andersen. 6045;409(81): 2061-2068  (http://education.[facility].com/faq/FAQ164)    Direct LDL  Date Value Ref Range Status  05/11/2020 154.0 mg/dL Final    Comment:    Optimal:  <100 mg/dLNear or Above Optimal:  100-129 mg/dLBorderline High:  130-159 mg/dLHigh:  160-189 mg/dLVery High:  >190 mg/dL   HDL  Date Value Ref Range Status  11/04/2021 32 (L) > OR = 40 mg/dL Final   Triglycerides  Date Value Ref Range Status  11/04/2021 142 <150 mg/dL Final         Passed - Last BP in normal range    BP Readings from Last 1 Encounters:  11/23/22 138/82

## 2023-06-12 ENCOUNTER — Encounter: Admitting: Internal Medicine

## 2023-06-12 NOTE — Progress Notes (Deleted)
 Subjective:    Patient ID: Joel Andersen, male    DOB: July 08, 1990, 33 y.o.   MRN: [id number]  HPI  Patient presents to clinic today for his annual exam.  Flu: Never Tetanus: 09/2017 COVID: [employer] x2 Dentist: as needed  Diet: He does eat meat. He consumes fruits and veggies. He tries to avoid fried foods. He drinks mostly water, soda and gatorade. Exercise: Walking  Review of Systems     Past Medical History:  Diagnosis Date   Anxiety     Current Outpatient Medications  Medication Sig Dispense Refill   atorvastatin  (LIPITOR) 10 MG tablet TAKE 1 TABLET BY MOUTH EVERY DAY 90 tablet 0   busPIRone  (BUSPAR ) 15 MG tablet Take 1 tablet (15 mg total) by mouth 2 (two) times daily. 180 tablet 0   hydrOXYzine  (VISTARIL ) 50 MG capsule Take 1 capsule (50 mg total) by mouth daily as needed. 90 capsule 1   venlafaxine  XR (EFFEXOR -XR) 150 MG 24 hr capsule TAKE 1 CAPSULE BY MOUTH DAILY WITH BREAKFAST. 90 capsule 0   venlafaxine  XR (EFFEXOR -XR) 75 MG 24 hr capsule TAKE 1 CAPSULE BY MOUTH DAILY WITH BREAKFAST. 90 capsule 0   No current facility-administered medications for this visit.    Allergies  Allergen Reactions   Hydrocodone-Acetaminophen  Rash    Family History  Problem Relation Age of Onset   Diabetes Mother    Hypertension Mother    Diabetes Maternal Grandmother    Hypertension Maternal Grandmother     Social History   Socioeconomic History   Marital status: Married    Spouse name: Not on file   Number of children: Not on file   Years of education: Not on file   Highest education level: Not on file  Occupational History   Not on file  Tobacco Use   Smoking status: Never   Smokeless tobacco: Current    Types: Chew  Vaping Use   Vaping status: Never Used  Substance and Sexual Activity   Alcohol use: Yes    Comment: rare   Drug use: Never   Sexual activity: Not on file  Other Topics Concern   Not on file  Social History Narrative   Not on file   Social  Drivers of Health   Financial Resource Strain: Not on file  Food Insecurity: Not on file  Transportation Needs: Not on file  Physical Activity: Not on file  Stress: Not on file  Social Connections: Not on file  Intimate Partner Violence: Not on file     Constitutional: Denies fever, malaise, fatigue, headache or abrupt weight changes.  HEENT: Denies eye pain, eye redness, ear pain, ringing in the ears, wax buildup, runny nose, nasal congestion, bloody nose, or sore throat. Respiratory: Denies difficulty breathing, shortness of breath, cough or sputum production.   Cardiovascular: Denies chest pain, chest tightness, palpitations or swelling in the hands or feet.  Gastrointestinal: Pt reports intermittent reflux. Denies abdominal pain, bloating, constipation, diarrhea or blood in the stool.  GU: Denies urgency, frequency, pain with urination, burning sensation, blood in urine, odor or discharge. Musculoskeletal: Denies decrease in range of motion, difficulty with gait, muscle pain or joint pain and swelling.  Skin: Denies redness, rashes, lesions or ulcercations.  Neurological: Denies dizziness, difficulty with memory, difficulty with speech or problems with balance and coordination.  Psych: Patient has a history of anxiety.  Denies depression, SI/HI.  No other specific complaints in a complete review of systems (except as listed in HPI above).  Objective:   Physical Exam   There were no vitals taken for this visit.  Wt Readings from Last 3 Encounters:  11/23/22 203 lb (92.1 kg)  02/08/22 198 lb (89.8 kg)  12/21/21 194 lb (88 kg)    General: Appears his stated age, overweight, in NAD. Skin: Warm, dry and intact. HEENT: Head: normal shape and size; Eyes: sclera white, no icterus, conjunctiva pink, PERRLA and EOMs intact;  Neck:  Neck supple, trachea midline. No masses, lumps or thyromegaly present.  Cardiovascular: Normal rate and rhythm. S1,S2 noted.  No murmur, rubs or gallops  noted. No JVD or BLE edema.  Pulmonary/Chest: Normal effort and positive vesicular breath sounds. No respiratory distress. No wheezes, rales or ronchi noted.  Abdomen:  Normal bowel sounds. Musculoskeletal: Straight 5/5 BUE/BLE no difficulty with gait.  Neurological: Alert and oriented. Cranial nerves II-XII grossly intact. Coordination normal.  Psychiatric: Mood and affect normal.  Anxious appearing. Judgment and thought content normal.    BMET    Component Value Date/Time   NA 139 11/04/2021 1429   K 4.0 11/04/2021 1429   CL 105 11/04/2021 1429   CO2 27 11/04/2021 1429   GLUCOSE 77 11/04/2021 1429   BUN 8 11/04/2021 1429   CREATININE 0.83 11/04/2021 1429   CALCIUM  9.5 11/04/2021 1429   GFRNONAA NOT CALCULATED 06/26/2007 1457   GFRAA  06/26/2007 1457    NOT CALCULATED        The eGFR has been calculated using the MDRD equation. This calculation has not been validated in all clinical    Lipid Panel     Component Value Date/Time   CHOL 202 (H) 11/04/2021 1429   TRIG 142 11/04/2021 1429   HDL 32 (L) 11/04/2021 1429   CHOLHDL 6.3 (H) 11/04/2021 1429   VLDL 43.8 (H) 05/11/2020 1445   LDLCALC 143 (H) 11/04/2021 1429    CBC    Component Value Date/Time   WBC 8.4 11/04/2021 1429   RBC 4.83 11/04/2021 1429   HGB 15.3 11/04/2021 1429   HCT 43.5 11/04/2021 1429   PLT 307 11/04/2021 1429   MCV 90.1 11/04/2021 1429   MCH 31.7 11/04/2021 1429   MCHC 35.2 11/04/2021 1429   RDW 13.0 11/04/2021 1429   LYMPHSABS 1.7 06/26/2007 1457   MONOABS 0.7 06/26/2007 1457   EOSABS 0.0 06/26/2007 1457   BASOSABS 0.1 06/26/2007 1457    Hgb A1C Lab Results  Component Value Date   HGBA1C 5.0 11/04/2021           Assessment & Plan:  Preventative health maintenance:  Encouraged him to get a flu shot in the fall Tetanus UTD Encouraged him to get his COVID booster Advised him to see a dentist annually We will ask CBC, c-Met, lipid, A1c today   RTC in 6 months, follow-up  chronic conditions Helayne Lo, NP

## 2023-09-04 ENCOUNTER — Other Ambulatory Visit: Payer: Self-pay | Admitting: Internal Medicine

## 2023-09-05 ENCOUNTER — Other Ambulatory Visit: Payer: Self-pay | Admitting: Internal Medicine

## 2023-09-05 NOTE — Telephone Encounter (Signed)
 Unable to refill per protocol, appointment needed.   Requested Prescriptions  Pending Prescriptions Disp Refills   venlafaxine  XR (EFFEXOR -XR) 150 MG 24 hr capsule [Pharmacy Med Name: VENLAFAXINE  HCL ER 150 MG CAP] 90 capsule 0    Sig: TAKE 1 CAPSULE BY MOUTH DAILY WITH BREAKFAST.     Psychiatry: Antidepressants - SNRI - desvenlafaxine & venlafaxine  Failed - 09/05/2023  9:41 AM      Failed - Cr in normal range and within 360 days    Creat  Date Value Ref Range Status  11/04/2021 0.83 0.60 - 1.26 mg/dL Final         Failed - Valid encounter within last 6 months    Recent Outpatient Visits   None            Failed - Lipid Panel in normal range within the last 12 months    Cholesterol  Date Value Ref Range Status  11/04/2021 202 (H) <200 mg/dL Final   LDL Cholesterol (Calc)  Date Value Ref Range Status  11/04/2021 143 (H) mg/dL (calc) Final    Comment:    Reference range: <100 . Desirable range <100 mg/dL for primary prevention;   <70 mg/dL for patients with CHD or diabetic patients  with > or = 2 CHD risk factors. SABRA LDL-C is now calculated using the Martin-Hopkins  calculation, which is a validated novel method providing  better accuracy than the Friedewald equation in the  estimation of LDL-C.  Joel Andersen et al. SANDREA. 7986;689(80): 2061-2068  (http://education.[facility].com/faq/FAQ164)    Direct LDL  Date Value Ref Range Status  05/11/2020 154.0 mg/dL Final    Comment:    Optimal:  <100 mg/dLNear or Above Optimal:  100-129 mg/dLBorderline High:  130-159 mg/dLHigh:  160-189 mg/dLVery High:  >190 mg/dL   HDL  Date Value Ref Range Status  11/04/2021 32 (L) > OR = 40 mg/dL Final   Triglycerides  Date Value Ref Range Status  11/04/2021 142 <150 mg/dL Final         Passed - Last BP in normal range    BP Readings from Last 1 Encounters:  11/23/22 138/82

## 2023-09-05 NOTE — Telephone Encounter (Signed)
 Requested medication (s) are due for refill today -yes  Requested medication (s) are on the active medication list -yes  Future visit scheduled -no  Last refill: 05/30/23 #90  Notes to clinic: fails lab protocol- over 1 year- 11/04/21  Requested Prescriptions  Pending Prescriptions Disp Refills   venlafaxine  XR (EFFEXOR -XR) 75 MG 24 hr capsule [Pharmacy Med Name: VENLAFAXINE  HCL ER 75 MG CAP] 90 capsule 0    Sig: TAKE 1 CAPSULE BY MOUTH DAILY WITH BREAKFAST.     Psychiatry: Antidepressants - SNRI - desvenlafaxine & venlafaxine  Failed - 09/05/2023  3:27 PM      Failed - Cr in normal range and within 360 days    Creat  Date Value Ref Range Status  11/04/2021 0.83 0.60 - 1.26 mg/dL Final         Failed - Valid encounter within last 6 months    Recent Outpatient Visits   None            Failed - Lipid Panel in normal range within the last 12 months    Cholesterol  Date Value Ref Range Status  11/04/2021 202 (H) <200 mg/dL Final   LDL Cholesterol (Calc)  Date Value Ref Range Status  11/04/2021 143 (H) mg/dL (calc) Final    Comment:    Reference range: <100 . Desirable range <100 mg/dL for primary prevention;   <70 mg/dL for patients with CHD or diabetic patients  with > or = 2 CHD risk factors. SABRA LDL-C is now calculated using the Martin-Hopkins  calculation, which is a validated novel method providing  better accuracy than the Friedewald equation in the  estimation of LDL-C.  Gladis APPLETHWAITE et al. SANDREA. 7986;689(80): 2061-2068  (http://education.[facility].com/faq/FAQ164)    Direct LDL  Date Value Ref Range Status  05/11/2020 154.0 mg/dL Final    Comment:    Optimal:  <100 mg/dLNear or Above Optimal:  100-129 mg/dLBorderline High:  130-159 mg/dLHigh:  160-189 mg/dLVery High:  >190 mg/dL   HDL  Date Value Ref Range Status  11/04/2021 32 (L) > OR = 40 mg/dL Final   Triglycerides  Date Value Ref Range Status  11/04/2021 142 <150 mg/dL Final         Passed -  Last BP in normal range    BP Readings from Last 1 Encounters:  11/23/22 138/82            Requested Prescriptions  Pending Prescriptions Disp Refills   venlafaxine  XR (EFFEXOR -XR) 75 MG 24 hr capsule [Pharmacy Med Name: VENLAFAXINE  HCL ER 75 MG CAP] 90 capsule 0    Sig: TAKE 1 CAPSULE BY MOUTH DAILY WITH BREAKFAST.     Psychiatry: Antidepressants - SNRI - desvenlafaxine & venlafaxine  Failed - 09/05/2023  3:27 PM      Failed - Cr in normal range and within 360 days    Creat  Date Value Ref Range Status  11/04/2021 0.83 0.60 - 1.26 mg/dL Final         Failed - Valid encounter within last 6 months    Recent Outpatient Visits   None            Failed - Lipid Panel in normal range within the last 12 months    Cholesterol  Date Value Ref Range Status  11/04/2021 202 (H) <200 mg/dL Final   LDL Cholesterol (Calc)  Date Value Ref Range Status  11/04/2021 143 (H) mg/dL (calc) Final    Comment:    Reference range: <100 . Desirable range <  100 mg/dL for primary prevention;   <70 mg/dL for patients with CHD or diabetic patients  with > or = 2 CHD risk factors. SABRA LDL-C is now calculated using the Martin-Hopkins  calculation, which is a validated novel method providing  better accuracy than the Friedewald equation in the  estimation of LDL-C.  Gladis APPLETHWAITE et al. SANDREA. 7986;689(80): 2061-2068  (http://education.[facility].com/faq/FAQ164)    Direct LDL  Date Value Ref Range Status  05/11/2020 154.0 mg/dL Final    Comment:    Optimal:  <100 mg/dLNear or Above Optimal:  100-129 mg/dLBorderline High:  130-159 mg/dLHigh:  160-189 mg/dLVery High:  >190 mg/dL   HDL  Date Value Ref Range Status  11/04/2021 32 (L) > OR = 40 mg/dL Final   Triglycerides  Date Value Ref Range Status  11/04/2021 142 <150 mg/dL Final         Passed - Last BP in normal range    BP Readings from Last 1 Encounters:  11/23/22 138/82

## 2024-01-22 ENCOUNTER — Other Ambulatory Visit: Payer: Self-pay | Admitting: Internal Medicine

## 2024-01-22 NOTE — Telephone Encounter (Unsigned)
 Copied from CRM #[id number]. Topic: Clinical - Medication Refill >> Jan 22, 2024 12:27 PM Joesph B wrote: Medication: venlafaxine  XR (EFFEXOR -XR) 150 MG 24 hr capsule  venlafaxine  XR (EFFEXOR -XR) 75 MG 24 hr capsule     Has the patient contacted their pharmacy? Yes (Agent: If no, request that the patient contact the pharmacy for the refill. If patient does not wish to contact the pharmacy document the reason why and proceed with request.) (Agent: If yes, when and what did the pharmacy advise?)  This is the patient's preferred pharmacy:  [facility] [phone] [facility] [place], [place] - [address] [place] [address] Phone: [phone] Fax: [phone]  Is this the correct pharmacy for this prescription? Yes If no, delete pharmacy and type the correct one.   Has the prescription been filled recently? Yes  Is the patient out of the medication? No  Has the patient been seen for an appointment in the last year OR does the patient have an upcoming appointment? Yes 02/06/24  Can we respond through MyChart? Yes  Agent: Please be advised that Rx refills may take up to 3 business days. We ask that you follow-up with your pharmacy.

## 2024-01-24 ENCOUNTER — Telehealth: Payer: Self-pay

## 2024-01-24 ENCOUNTER — Other Ambulatory Visit: Payer: Self-pay | Admitting: Internal Medicine

## 2024-01-24 MED ORDER — BUSPIRONE HCL 15 MG PO TABS: 15.0000 mg | ORAL_TABLET | Freq: Two times a day (BID) | ORAL | 0 refills | Status: DC

## 2024-01-24 MED ORDER — VENLAFAXINE HCL ER 150 MG PO CP24: 150.0000 mg | ORAL_CAPSULE | Freq: Every day | ORAL | 0 refills | Status: DC

## 2024-01-24 MED ORDER — HYDROXYZINE PAMOATE 50 MG PO CAPS: 50.0000 mg | ORAL_CAPSULE | Freq: Every day | ORAL | 0 refills | Status: DC | PRN

## 2024-01-24 MED ORDER — VENLAFAXINE HCL ER 75 MG PO CP24: 75.0000 mg | ORAL_CAPSULE | Freq: Every day | ORAL | 0 refills | Status: DC

## 2024-01-24 NOTE — Telephone Encounter (Signed)
 Good morning.  [facility] does not have it where he can get into his MyChart    We sent a request for a refill of his meds.  He has an apt on the 31st. He doesnt need a full refill just an emergency fill of enough to get him to that apt. He tried to come to the last one and had a panic attack in the parking lot and wouldnt come into the building. I have someone that is bringing him on the 31st to make sure he makes it into that office for this appointment.    Joel Andersen 09/10/1980

## 2024-02-05 NOTE — Progress Notes (Unsigned)
 "  Subjective:    Patient ID: Joel Andersen, male    DOB: Jun 25, 1990, 33 y.o.   MRN: [id number]  HPI  Patient presents to clinic today for follow-up of chronic conditions.  GAD: Chronic, managed on venlafaxine , hydroxyzine  and buspirone .  He is not currently seeing a therapist.  He denies depression, SI/HI.  HLD: His last LDL was 143, triglycerides 142, 10/2021.  He stopped taking atorvastatin  because it was causing headaches.  He does not consume a low-fat diet.  GERD: He is not sure what triggers this.  He takes omeprazole OTC with good relief of symptoms.  There is no upper GI on file.  Review of Systems     Past Medical History:  Diagnosis Date   Anxiety     Current Outpatient Medications  Medication Sig Dispense Refill   atorvastatin  (LIPITOR) 10 MG tablet TAKE 1 TABLET BY MOUTH EVERY DAY 90 tablet 0   busPIRone  (BUSPAR ) 15 MG tablet Take 1 tablet (15 mg total) by mouth 2 (two) times daily. 60 tablet 0   hydrOXYzine  (VISTARIL ) 50 MG capsule Take 1 capsule (50 mg total) by mouth daily as needed. 30 capsule 0   venlafaxine  XR (EFFEXOR -XR) 150 MG 24 hr capsule Take 1 capsule (150 mg total) by mouth daily with breakfast. 30 capsule 0   venlafaxine  XR (EFFEXOR -XR) 75 MG 24 hr capsule Take 1 capsule (75 mg total) by mouth daily with breakfast. 30 capsule 0   No current facility-administered medications for this visit.    Allergies  Allergen Reactions   Hydrocodone-Acetaminophen  Rash    Family History  Problem Relation Age of Onset   Diabetes Mother    Hypertension Mother    Diabetes Maternal Grandmother    Hypertension Maternal Grandmother     Social History   Socioeconomic History   Marital status: Married    Spouse name: Not on file   Number of children: Not on file   Years of education: Not on file   Highest education level: Not on file  Occupational History   Not on file  Tobacco Use   Smoking status: Never   Smokeless tobacco: Current    Types: Chew   Vaping Use   Vaping status: Never Used  Substance and Sexual Activity   Alcohol use: Yes    Comment: rare   Drug use: Never   Sexual activity: Not on file  Other Topics Concern   Not on file  Social History Narrative   Not on file   Social Drivers of Health   Tobacco Use: High Risk (11/23/2022)   Patient History    Smoking Tobacco Use: Never    Smokeless Tobacco Use: Current    Passive Exposure: Not on file  Financial Resource Strain: Not on file  Food Insecurity: Not on file  Transportation Needs: Not on file  Physical Activity: Not on file  Stress: Not on file  Social Connections: Not on file  Intimate Partner Violence: Not on file  Depression (PHQ2-9): Low Risk (02/08/2022)   Depression (PHQ2-9)    PHQ-2 Score: 0  Alcohol Screen: Low Risk (11/04/2021)   Alcohol Screen    Last Alcohol Screening Score (AUDIT): 0  Housing: Not on file  Utilities: Not on file  Health Literacy: Not on file     Constitutional: Denies fever, malaise, fatigue, headache or abrupt weight changes.  HEENT: Denies eye pain, eye redness, ear pain, ringing in the ears, wax buildup, runny nose, nasal congestion, bloody nose, or sore  throat. Respiratory: Denies difficulty breathing, shortness of breath, cough or sputum production.   Cardiovascular: Denies chest pain, chest tightness, palpitations or swelling in the hands or feet.  Gastrointestinal: Pt reports intermittent reflux. Denies abdominal pain, bloating, constipation, diarrhea or blood in the stool.  GU: Denies urgency, frequency, pain with urination, burning sensation, blood in urine, odor or discharge. Musculoskeletal: Denies decrease in range of motion, difficulty with gait, muscle pain or joint pain and swelling.  Skin: Denies redness, rashes, lesions or ulcercations.  Neurological: Denies dizziness, difficulty with memory, difficulty with speech or problems with balance and coordination.  Psych: Patient has a history of anxiety.  Denies  depression, SI/HI.  No other specific complaints in a complete review of systems (except as listed in HPI above).  Objective:   Physical Exam  BP 134/82 (BP Location: Right Arm, Patient Position: Sitting, Cuff Size: Large)   Ht 5' 10 (1.778 m)   Wt 201 lb 9.6 oz (91.4 kg)   BMI 28.93 kg/m    Wt Readings from Last 3 Encounters:  11/23/22 203 lb (92.1 kg)  02/08/22 198 lb (89.8 kg)  12/21/21 194 lb (88 kg)    General: Appears his stated age, overweight, in NAD. Skin: Warm, dry and intact. HEENT: Head: normal shape and size; Eyes: sclera white, no icterus, conjunctiva pink, PERRLA and EOMs intact;  Cardiovascular: Normal rate and rhythm. S1,S2 noted.  No murmur, rubs or gallops noted.  Pulmonary/Chest: Normal effort and positive vesicular breath sounds. No respiratory distress. No wheezes, rales or ronchi noted.  Abdomen:  Normal bowel sounds. Musculoskeletal: No difficulty with gait.  Neurological: Alert and oriented. Cranial nerves II-XII grossly intact. Coordination normal.  Psychiatric: Mood and affect normal.  Behavior is normal. Judgment and thought content normal.    BMET    Component Value Date/Time   NA 139 11/04/2021 1429   K 4.0 11/04/2021 1429   CL 105 11/04/2021 1429   CO2 27 11/04/2021 1429   GLUCOSE 77 11/04/2021 1429   BUN 8 11/04/2021 1429   CREATININE 0.83 11/04/2021 1429   CALCIUM  9.5 11/04/2021 1429   GFRNONAA NOT CALCULATED 06/26/2007 1457   GFRAA  06/26/2007 1457    NOT CALCULATED        The eGFR has been calculated using the MDRD equation. This calculation has not been validated in all clinical    Lipid Panel     Component Value Date/Time   CHOL 202 (H) 11/04/2021 1429   TRIG 142 11/04/2021 1429   HDL 32 (L) 11/04/2021 1429   CHOLHDL 6.3 (H) 11/04/2021 1429   VLDL 43.8 (H) 05/11/2020 1445   LDLCALC 143 (H) 11/04/2021 1429    CBC    Component Value Date/Time   WBC 8.4 11/04/2021 1429   RBC 4.83 11/04/2021 1429   HGB 15.3  11/04/2021 1429   HCT 43.5 11/04/2021 1429   PLT 307 11/04/2021 1429   MCV 90.1 11/04/2021 1429   MCH 31.7 11/04/2021 1429   MCHC 35.2 11/04/2021 1429   RDW 13.0 11/04/2021 1429   LYMPHSABS 1.7 06/26/2007 1457   MONOABS 0.7 06/26/2007 1457   EOSABS 0.0 06/26/2007 1457   BASOSABS 0.1 06/26/2007 1457    Hgb A1C Lab Results  Component Value Date   HGBA1C 5.0 11/04/2021           Assessment & Plan:    RTC in 6 months for follow-up of chronic conditions Angeline Laura, NP  "

## 2024-02-06 ENCOUNTER — Encounter: Payer: Self-pay | Admitting: Internal Medicine

## 2024-02-06 ENCOUNTER — Ambulatory Visit: Admitting: Internal Medicine

## 2024-02-06 VITALS — BP 134/82 | Ht 70.0 in | Wt 201.6 lb

## 2024-02-06 DIAGNOSIS — K219 Gastro-esophageal reflux disease without esophagitis: Secondary | ICD-10-CM | POA: Diagnosis not present

## 2024-02-06 DIAGNOSIS — F411 Generalized anxiety disorder: Secondary | ICD-10-CM | POA: Diagnosis not present

## 2024-02-06 DIAGNOSIS — E663 Overweight: Secondary | ICD-10-CM | POA: Diagnosis not present

## 2024-02-06 DIAGNOSIS — E782 Mixed hyperlipidemia: Secondary | ICD-10-CM

## 2024-02-06 DIAGNOSIS — Z6828 Body mass index (BMI) 28.0-28.9, adult: Secondary | ICD-10-CM | POA: Diagnosis not present

## 2024-02-06 MED ORDER — BUSPIRONE HCL 15 MG PO TABS: 15.0000 mg | ORAL_TABLET | Freq: Two times a day (BID) | ORAL | 1 refills | Status: AC

## 2024-02-06 MED ORDER — VENLAFAXINE HCL ER 150 MG PO CP24: 150.0000 mg | ORAL_CAPSULE | Freq: Every day | ORAL | 1 refills | Status: AC

## 2024-02-06 MED ORDER — VENLAFAXINE HCL ER 75 MG PO CP24: 75.0000 mg | ORAL_CAPSULE | Freq: Every day | ORAL | 1 refills | Status: AC

## 2024-02-06 MED ORDER — HYDROXYZINE PAMOATE 50 MG PO CAPS: 50.0000 mg | ORAL_CAPSULE | Freq: Every day | ORAL | 1 refills | Status: AC | PRN

## 2024-02-06 NOTE — Assessment & Plan Note (Signed)
 Continue omeprazole 20 mg OTC as needed Encouraged weight loss as this can help reduce reflux symptoms

## 2024-02-06 NOTE — Assessment & Plan Note (Signed)
 Encourage diet and exercise for weight loss

## 2024-02-06 NOTE — Patient Instructions (Signed)

## 2024-02-06 NOTE — Assessment & Plan Note (Signed)
 Will check lipid profile with annual exam Not currently taking atorvastatin  10 mg daily Encouraged him to consume a low saturated fat diet

## 2024-02-06 NOTE — Assessment & Plan Note (Addendum)
 Continue venlafaxine  to 225 mg daily, hydroxyzine  50 mg daily as needed and buspirone  15 mg twice daily Support offered

## 2024-08-15 ENCOUNTER — Encounter: Admitting: Internal Medicine
